# Patient Record
Sex: Female | Born: 1995
Health system: Southern US, Community
[De-identification: ages and names within clinical notes are randomized; demographics above are authoritative.]

## PROBLEM LIST (undated history)

## (undated) DIAGNOSIS — N6320 Unspecified lump in the left breast, unspecified quadrant: Secondary | ICD-10-CM

## (undated) DIAGNOSIS — F99 Mental disorder, not otherwise specified: Secondary | ICD-10-CM

## (undated) DIAGNOSIS — F419 Anxiety disorder, unspecified: Secondary | ICD-10-CM

## (undated) HISTORY — DX: Unspecified lump in the left breast, unspecified quadrant: N63.20

## (undated) HISTORY — DX: Mental disorder, not otherwise specified: F99

---
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
## 2017-01-13 DIAGNOSIS — Z86018 Personal history of other benign neoplasm: Secondary | ICD-10-CM | POA: Diagnosis not present

## 2017-01-13 DIAGNOSIS — B078 Other viral warts: Secondary | ICD-10-CM | POA: Diagnosis not present

## 2017-01-13 DIAGNOSIS — D224 Melanocytic nevi of scalp and neck: Secondary | ICD-10-CM | POA: Diagnosis not present

## 2017-01-13 DIAGNOSIS — L814 Other melanin hyperpigmentation: Secondary | ICD-10-CM | POA: Diagnosis not present

## 2017-03-03 DIAGNOSIS — R252 Cramp and spasm: Secondary | ICD-10-CM | POA: Diagnosis not present

## 2017-03-03 DIAGNOSIS — E7800 Pure hypercholesterolemia, unspecified: Secondary | ICD-10-CM | POA: Diagnosis not present

## 2017-03-03 DIAGNOSIS — R079 Chest pain, unspecified: Secondary | ICD-10-CM | POA: Diagnosis not present

## 2017-03-03 DIAGNOSIS — E162 Hypoglycemia, unspecified: Secondary | ICD-10-CM | POA: Diagnosis not present

## 2017-03-03 DIAGNOSIS — Z79899 Other long term (current) drug therapy: Secondary | ICD-10-CM | POA: Diagnosis not present

## 2017-03-04 DIAGNOSIS — E7800 Pure hypercholesterolemia, unspecified: Secondary | ICD-10-CM | POA: Diagnosis not present

## 2017-03-04 DIAGNOSIS — R079 Chest pain, unspecified: Secondary | ICD-10-CM | POA: Diagnosis not present

## 2017-03-20 DIAGNOSIS — Z23 Encounter for immunization: Secondary | ICD-10-CM | POA: Diagnosis not present

## 2017-03-22 ENCOUNTER — Encounter: Payer: Self-pay | Admitting: Obstetrics and Gynecology

## 2017-03-31 DIAGNOSIS — D485 Neoplasm of uncertain behavior of skin: Secondary | ICD-10-CM | POA: Diagnosis not present

## 2017-03-31 DIAGNOSIS — D225 Melanocytic nevi of trunk: Secondary | ICD-10-CM | POA: Diagnosis not present

## 2017-04-22 DIAGNOSIS — L700 Acne vulgaris: Secondary | ICD-10-CM | POA: Diagnosis not present

## 2017-04-22 DIAGNOSIS — D485 Neoplasm of uncertain behavior of skin: Secondary | ICD-10-CM | POA: Diagnosis not present

## 2017-05-06 DIAGNOSIS — L237 Allergic contact dermatitis due to plants, except food: Secondary | ICD-10-CM | POA: Diagnosis not present

## 2017-05-10 DIAGNOSIS — R5383 Other fatigue: Secondary | ICD-10-CM | POA: Diagnosis not present

## 2017-05-10 DIAGNOSIS — K921 Melena: Secondary | ICD-10-CM | POA: Diagnosis not present

## 2017-05-10 DIAGNOSIS — R21 Rash and other nonspecific skin eruption: Secondary | ICD-10-CM | POA: Diagnosis not present

## 2017-05-13 DIAGNOSIS — K921 Melena: Secondary | ICD-10-CM | POA: Diagnosis not present

## 2017-06-21 DIAGNOSIS — J0120 Acute ethmoidal sinusitis, unspecified: Secondary | ICD-10-CM | POA: Diagnosis not present

## 2017-07-12 DIAGNOSIS — N390 Urinary tract infection, site not specified: Secondary | ICD-10-CM | POA: Diagnosis not present

## 2017-07-20 DIAGNOSIS — K529 Noninfective gastroenteritis and colitis, unspecified: Secondary | ICD-10-CM | POA: Diagnosis not present

## 2017-07-20 DIAGNOSIS — R369 Urethral discharge, unspecified: Secondary | ICD-10-CM | POA: Diagnosis not present

## 2017-07-20 DIAGNOSIS — N760 Acute vaginitis: Secondary | ICD-10-CM | POA: Diagnosis not present

## 2017-07-20 DIAGNOSIS — R300 Dysuria: Secondary | ICD-10-CM | POA: Diagnosis not present

## 2017-08-12 ENCOUNTER — Telehealth: Payer: Self-pay | Admitting: Obstetrics and Gynecology

## 2017-08-12 NOTE — Telephone Encounter (Signed)
Patient called requesting to speak with the nurse about the possibility of getting labiaplasty.

## 2017-08-15 NOTE — Telephone Encounter (Signed)
Call returned to patient, no answer, unable to leave message, unable to leave message.

## 2017-08-15 NOTE — Telephone Encounter (Signed)
Please make an appointment with me to re-assess.

## 2017-08-15 NOTE — Telephone Encounter (Signed)
Patient returning call.

## 2017-08-15 NOTE — Telephone Encounter (Signed)
Call returned, no answer, mailbox full unable to leave message.

## 2017-08-15 NOTE — Telephone Encounter (Signed)
Spoke with patient. Patient inquiring about labiaplasty, states she discussed this with Dr. Quincy Simmonds at last AEX, but felt this was not an option at that time.   Patient is concerned about the appearance of her labia and would like to revisit discussing labiaplasty again if it will be considered by Dr. Quincy Simmonds or other providers in our office. Advised will review with Dr. Quincy Simmonds and return call with recommendations.   Dr. Quincy Simmonds - please review and advise?

## 2017-08-16 NOTE — Telephone Encounter (Signed)
Patient returned call to nurse Jill. °

## 2017-08-17 NOTE — Telephone Encounter (Signed)
Spoke with patient, OV scheduled for 09/05/17 at 3:30pm with Dr. Quincy Simmonds. Patient is agreeable to date and time.  Routing to provider for final review. Patient is agreeable to disposition. Will close encounter.

## 2017-09-02 ENCOUNTER — Telehealth: Payer: Self-pay | Admitting: Obstetrics and Gynecology

## 2017-09-02 NOTE — Telephone Encounter (Signed)
Patient cancelled appointment and is not ready to reschedule at this time.

## 2017-09-02 NOTE — Telephone Encounter (Signed)
Thank you for the update.  Encounter closed. 

## 2017-09-05 ENCOUNTER — Ambulatory Visit: Payer: Self-pay | Admitting: Obstetrics and Gynecology

## 2017-10-28 DIAGNOSIS — L03113 Cellulitis of right upper limb: Secondary | ICD-10-CM | POA: Diagnosis not present

## 2017-10-28 DIAGNOSIS — S40861A Insect bite (nonvenomous) of right upper arm, initial encounter: Secondary | ICD-10-CM | POA: Diagnosis not present

## 2017-11-06 DIAGNOSIS — L255 Unspecified contact dermatitis due to plants, except food: Secondary | ICD-10-CM | POA: Diagnosis not present

## 2017-11-08 DIAGNOSIS — D485 Neoplasm of uncertain behavior of skin: Secondary | ICD-10-CM | POA: Diagnosis not present

## 2017-11-08 DIAGNOSIS — D224 Melanocytic nevi of scalp and neck: Secondary | ICD-10-CM | POA: Diagnosis not present

## 2017-11-08 DIAGNOSIS — L237 Allergic contact dermatitis due to plants, except food: Secondary | ICD-10-CM | POA: Diagnosis not present

## 2017-11-29 DIAGNOSIS — Z0000 Encounter for general adult medical examination without abnormal findings: Secondary | ICD-10-CM | POA: Diagnosis not present

## 2017-12-21 DIAGNOSIS — E7800 Pure hypercholesterolemia, unspecified: Secondary | ICD-10-CM | POA: Diagnosis not present

## 2018-01-06 ENCOUNTER — Other Ambulatory Visit (HOSPITAL_COMMUNITY)
Admission: RE | Admit: 2018-01-06 | Discharge: 2018-01-06 | Disposition: A | Discharge status: Home / Self Care | Payer: 59 | Source: Ambulatory Visit | Attending: Obstetrics and Gynecology | Admitting: Obstetrics and Gynecology

## 2018-01-06 ENCOUNTER — Other Ambulatory Visit: Payer: Self-pay

## 2018-01-06 ENCOUNTER — Ambulatory Visit: Payer: 59 | Admitting: Obstetrics and Gynecology

## 2018-01-06 ENCOUNTER — Encounter: Payer: Self-pay | Admitting: Obstetrics and Gynecology

## 2018-01-06 VITALS — BP 108/60 | HR 76 | Resp 16 | Ht 62.0 in | Wt 113.0 lb

## 2018-01-06 DIAGNOSIS — Z01419 Encounter for gynecological examination (general) (routine) without abnormal findings: Secondary | ICD-10-CM

## 2018-01-06 DIAGNOSIS — Z113 Encounter for screening for infections with a predominantly sexual mode of transmission: Secondary | ICD-10-CM | POA: Insufficient documentation

## 2018-01-06 DIAGNOSIS — Z23 Encounter for immunization: Secondary | ICD-10-CM

## 2018-01-06 MED ORDER — NORGESTIM-ETH ESTRAD TRIPHASIC 0.18/0.215/0.25 MG-35 MCG PO TABS: 1.0000 | ORAL_TABLET | Freq: Every day | ORAL | 3 refills | Status: DC

## 2018-01-06 NOTE — Patient Instructions (Signed)

## 2018-01-06 NOTE — Progress Notes (Signed)
22 y.o. G66P0000 Single Caucasian female here for annual exam.    Asking about Gardasil vaccine.   Menses are light on her OCPs.  Cramping for the first two days.   Same partner for 6 years.  Living together in their own home.   Saw her PCP about her cholesterol and TG. She will have repeat labs in August.   Going to radiography school.  PCP: Crissie Sickles, PA-C    Patient's last menstrual period was 12/19/2017.           Sexually active: Yes.    The current method of family planning is Tri-Previfem.    Exercising: No.  The patient does not participate in regular exercise at present. Smoker:  no  Health Maintenance: Pap:  n/a History of abnormal Pap:  n/a TDaP:  UTD Gardasil:   no HIV and Hep C: 10/18/16 Negative Screening Labs:  Discuss today   reports that she has never smoked. She has never used smokeless tobacco. She reports that she does not drink alcohol or use drugs.  History reviewed. No pertinent past medical history.  Past Surgical History:  Procedure Laterality Date  . broken finger Right 2015   --ring finger    Current Outpatient Medications  Medication Sig Dispense Refill  . Norgestimate-Ethinyl Estradiol Triphasic (TRI-PREVIFEM) 0.18/0.215/0.25 MG-35 MCG tablet Take 1 tablet by mouth daily. 3 Package 3   No current facility-administered medications for this visit.     Family History  Problem Relation Age of Onset  . Diabetes Maternal Grandmother   . Hypertension Maternal Grandmother   . Hypertension Maternal Grandfather   . Osteoarthritis Maternal Grandfather     Review of Systems  All other systems reviewed and are negative.   Exam:   BP 108/60 (BP Location: Right Arm, Patient Position: Sitting, Cuff Size: Normal)   Pulse 76   Resp 16   Ht 5\' 2"  (1.575 m)   Wt 113 lb (51.3 kg)   LMP 12/19/2017   BMI 20.67 kg/m     General appearance: alert, cooperative and appears stated age Head: Normocephalic, without obvious abnormality,  atraumatic Neck: no adenopathy, supple, symmetrical, trachea midline and thyroid normal to inspection and palpation Lungs: clear to auscultation bilaterally Breasts: normal appearance, no masses or tenderness, No nipple retraction or dimpling, No nipple discharge or bleeding, No axillary or supraclavicular adenopathy Heart: regular rate and rhythm Abdomen: soft, non-tender; no masses, no organomegaly Extremities: extremities normal, atraumatic, no cyanosis or edema Skin: Skin color, texture, turgor normal. No rashes or lesions Lymph nodes: Cervical, supraclavicular, and axillary nodes normal. No abnormal inguinal nodes palpated Neurologic: Grossly normal  Pelvic: External genitalia:  no lesions              Urethra:  normal appearing urethra with no masses, tenderness or lesions              Bartholins and Skenes: normal                 Vagina: normal appearing vagina with normal color and discharge, no lesions              Cervix: no lesions              Pap taken: No. Bimanual Exam:  Uterus:  normal size, contour, position, consistency, mobility, non-tender              Adnexa: no mass, fullness, tenderness            Chaperone was  present for exam.  Assessment:   Well woman visit with normal exam. STD screening.   Plan: Mammogram screening age 37. Recommended self breast awareness. Pap and HR HPV age 3. Guidelines for Calcium, Vitamin D, regular exercise program including cardiovascular and weight bearing exercise. Refill of OCPs for one year.  Start Gardasil vaccine series. STD screening.  Follow up annually and prn.    After visit summary provided.

## 2018-01-07 LAB — HEP, RPR, HIV PANEL
HIV Screen 4th Generation wRfx: NONREACTIVE
Hepatitis B Surface Ag: NEGATIVE
RPR: NONREACTIVE

## 2018-01-07 LAB — HEPATITIS C ANTIBODY

## 2018-01-09 LAB — CERVICOVAGINAL ANCILLARY ONLY
CHLAMYDIA, DNA PROBE: NEGATIVE
NEISSERIA GONORRHEA: NEGATIVE
TRICH (WINDOWPATH): NEGATIVE

## 2018-01-10 ENCOUNTER — Telehealth: Payer: Self-pay

## 2018-01-10 NOTE — Telephone Encounter (Signed)
Spoke with patient. Results given. Patient verbalizes understanding. Encounter closed. 

## 2018-01-10 NOTE — Telephone Encounter (Signed)
-----   Message from Nunzio Cobbs, MD sent at 01/09/2018  5:25 PM EDT ----- Please report negative STD screening to patient.  This includes HIV, syphilis, hep B and C, gonorrhea, chlamydia, and trichomonas.  I am highlighting this so you know to contact the patient.

## 2018-02-14 DIAGNOSIS — L237 Allergic contact dermatitis due to plants, except food: Secondary | ICD-10-CM | POA: Diagnosis not present

## 2018-02-15 ENCOUNTER — Ambulatory Visit
Admission: RE | Admit: 2018-02-15 | Discharge: 2018-02-15 | Disposition: A | Discharge status: Home / Self Care | Payer: 59 | Source: Ambulatory Visit | Attending: *Deleted | Admitting: *Deleted

## 2018-02-15 ENCOUNTER — Telehealth: Payer: Self-pay | Admitting: Obstetrics and Gynecology

## 2018-02-15 ENCOUNTER — Other Ambulatory Visit: Payer: Self-pay | Admitting: *Deleted

## 2018-02-15 ENCOUNTER — Ambulatory Visit: Payer: Self-pay | Admitting: Obstetrics and Gynecology

## 2018-02-15 DIAGNOSIS — N63 Unspecified lump in unspecified breast: Secondary | ICD-10-CM

## 2018-02-15 DIAGNOSIS — N6320 Unspecified lump in the left breast, unspecified quadrant: Secondary | ICD-10-CM

## 2018-02-15 DIAGNOSIS — N6321 Unspecified lump in the left breast, upper outer quadrant: Secondary | ICD-10-CM | POA: Diagnosis not present

## 2018-02-15 DIAGNOSIS — N6323 Unspecified lump in the left breast, lower outer quadrant: Secondary | ICD-10-CM | POA: Diagnosis not present

## 2018-02-15 NOTE — Telephone Encounter (Signed)
Patient is asking to talk with [name] again.

## 2018-02-15 NOTE — Telephone Encounter (Signed)
Reviewed with Dr. Quincy Simmonds, call returned to patient. OV scheduled for today at 3:45pm.   Patient verbalizes understanding and is agreeable. Encounter closed.

## 2018-02-15 NOTE — Telephone Encounter (Signed)
Spoke with patient. Reports left breast lump under nipple, hard, pea size. Noticed last night. Denies nipple d/c, pain or skin changes. On menses now. Patient requesting order for breast US.   Recommended OV for further evaluation, patient declined OV offered for 9/5 or 9/6. Patient request first available provider. Advised I will review schedule with Dr. Quincy Simmonds and return call, patient agreeable.

## 2018-02-15 NOTE — Telephone Encounter (Signed)
Patient found a breast lump yesterday. Patient is asking for an order for a MMG.

## 2018-02-15 NOTE — Telephone Encounter (Signed)
Spoke with patient. Patient request to cancel OV scheduled for today for left breast lump. Patient states she "got an order for breast ultrasound". OV canceled for today with Dr. Quincy Simmonds.   Routing to provider for final review. Patient is agreeable to disposition. Will close encounter.

## 2018-02-21 DIAGNOSIS — Z79899 Other long term (current) drug therapy: Secondary | ICD-10-CM | POA: Diagnosis not present

## 2018-02-21 DIAGNOSIS — R12 Heartburn: Secondary | ICD-10-CM | POA: Diagnosis not present

## 2018-02-21 DIAGNOSIS — E7800 Pure hypercholesterolemia, unspecified: Secondary | ICD-10-CM | POA: Diagnosis not present

## 2018-03-02 ENCOUNTER — Telehealth: Payer: Self-pay | Admitting: Obstetrics and Gynecology

## 2018-03-02 NOTE — Telephone Encounter (Signed)
Called patient to reschedule her 2nd Gardasil to a different day than 03/09/18 but she declined to reschedule. She said she does not want to continue with getting these vaccines.

## 2018-03-03 NOTE — Telephone Encounter (Signed)
Thank you for the update.  Encounter closed. 

## 2018-03-09 ENCOUNTER — Ambulatory Visit: Payer: 59

## 2018-03-09 DIAGNOSIS — R7989 Other specified abnormal findings of blood chemistry: Secondary | ICD-10-CM | POA: Diagnosis not present

## 2018-03-13 DIAGNOSIS — J018 Other acute sinusitis: Secondary | ICD-10-CM | POA: Diagnosis not present

## 2018-04-14 DIAGNOSIS — L659 Nonscarring hair loss, unspecified: Secondary | ICD-10-CM | POA: Diagnosis not present

## 2018-04-14 DIAGNOSIS — R799 Abnormal finding of blood chemistry, unspecified: Secondary | ICD-10-CM | POA: Diagnosis not present

## 2018-05-03 DIAGNOSIS — J069 Acute upper respiratory infection, unspecified: Secondary | ICD-10-CM | POA: Diagnosis not present

## 2018-05-03 DIAGNOSIS — Z6822 Body mass index (BMI) 22.0-22.9, adult: Secondary | ICD-10-CM | POA: Diagnosis not present

## 2018-05-03 DIAGNOSIS — M791 Myalgia, unspecified site: Secondary | ICD-10-CM | POA: Diagnosis not present

## 2018-05-18 DIAGNOSIS — Z6823 Body mass index (BMI) 23.0-23.9, adult: Secondary | ICD-10-CM | POA: Diagnosis not present

## 2018-05-18 DIAGNOSIS — L290 Pruritus ani: Secondary | ICD-10-CM | POA: Diagnosis not present

## 2018-05-18 DIAGNOSIS — L237 Allergic contact dermatitis due to plants, except food: Secondary | ICD-10-CM | POA: Diagnosis not present

## 2018-07-31 DIAGNOSIS — Z6823 Body mass index (BMI) 23.0-23.9, adult: Secondary | ICD-10-CM | POA: Diagnosis not present

## 2018-07-31 DIAGNOSIS — R21 Rash and other nonspecific skin eruption: Secondary | ICD-10-CM | POA: Diagnosis not present

## 2018-08-09 ENCOUNTER — Other Ambulatory Visit: Payer: 59

## 2018-08-11 ENCOUNTER — Ambulatory Visit
Admission: RE | Admit: 2018-08-11 | Discharge: 2018-08-11 | Disposition: A | Discharge status: Home / Self Care | Payer: 59 | Source: Ambulatory Visit | Attending: *Deleted | Admitting: *Deleted

## 2018-08-11 ENCOUNTER — Other Ambulatory Visit: Payer: Self-pay | Admitting: *Deleted

## 2018-08-11 DIAGNOSIS — N6320 Unspecified lump in the left breast, unspecified quadrant: Secondary | ICD-10-CM

## 2018-08-11 DIAGNOSIS — N6002 Solitary cyst of left breast: Secondary | ICD-10-CM | POA: Diagnosis not present

## 2018-08-17 ENCOUNTER — Other Ambulatory Visit: Payer: 59

## 2018-10-12 ENCOUNTER — Ambulatory Visit: Payer: 59 | Admitting: Allergy and Immunology

## 2018-11-24 ENCOUNTER — Other Ambulatory Visit: Payer: Self-pay | Admitting: *Deleted

## 2018-11-24 NOTE — Telephone Encounter (Signed)
Medication refill request: Tri- previfem  Last AEX:  01-06-18 BS Next AEX: 01-19-2019  Last MMG (if hormonal medication request): 08-11-2018 BIRADS 3 probably benign, f/u 02/2019 Refill authorized: Today, please advise.   Medication pended for #3, 0RF as patient has aex on 01-19-2019. Please refill if appropriate.

## 2018-11-25 ENCOUNTER — Encounter: Payer: Self-pay | Admitting: Obstetrics and Gynecology

## 2018-11-25 MED ORDER — NORGESTIM-ETH ESTRAD TRIPHASIC 0.18/0.215/0.25 MG-35 MCG PO TABS: 1.0000 | ORAL_TABLET | Freq: Every day | ORAL | 0 refills | Status: DC

## 2019-01-18 ENCOUNTER — Other Ambulatory Visit: Payer: Self-pay

## 2019-01-19 ENCOUNTER — Ambulatory Visit: Payer: 59 | Admitting: Obstetrics and Gynecology

## 2019-01-19 NOTE — Progress Notes (Signed)
23 y.o. G0P0000 Single Caucasian female here for annual exam.   Patient lost her OCPs and hasn't been on them for 2 months. She want to continue on them.   Patient states has lump left breast [facility]  Her mother works there.  Her PCP ordered the testing.   Had ultrasound last year in September and has a probable cyst versus fibroadenoma. She had a follow up ultrasound in February and the cystic area is stable.  She is due again in September this year.   Studying x-ray technology. Engaged. They live together.   PCP:  Ernestene Kiel, MD   Patient's last menstrual period was 01/07/2019 (approximate).           Sexually active: Yes.    The current method of family planning is none.    Exercising: No.  The patient does not participate in regular exercise at present. Smoker:  no  Health Maintenance: Pap:  never History of abnormal Pap:  n/a MMG:  n/a Colonoscopy:  n/a BMD:   n/a  Result  n/a TDaP: ??due Gardasil:  Did 1st injection only;01-06-18 HIV:10-18-16 NR Hep C: 10-18-16 Neg Screening Labs:  ---   reports that she has never smoked. She has never used smokeless tobacco. She reports that she does not drink alcohol or use drugs.  Past Medical History:  Diagnosis Date  . Left breast mass    complicated cyst versus fibroadenoma - followed by ultrasound    Past Surgical History:  Procedure Laterality Date  . broken finger Right 2015   --ring finger    Current Outpatient Medications  Medication Sig Dispense Refill  . escitalopram (LEXAPRO) 5 MG tablet Take 1 tablet by mouth daily.    . famotidine (PEPCID) 20 MG tablet Take 1 tablet by mouth as needed.    . Norgestimate-Ethinyl Estradiol Triphasic (TRI-PREVIFEM) 0.18/0.215/0.25 MG-35 MCG tablet Take 1 tablet by mouth daily. (Patient not taking: Reported on 01/23/2019) 3 Package 0   No current facility-administered medications for this visit.     Family History  Problem Relation Age of  Onset  . Diabetes Maternal Grandmother   . Hypertension Maternal Grandmother   . Hypertension Maternal Grandfather   . Osteoarthritis Maternal Grandfather   . Breast cancer Paternal Aunt        early 64's    Review of Systems  All other systems reviewed and are negative.   Exam:   BP 100/60   Pulse 76   Temp (!) 97.3 F (36.3 C) (Temporal)   Resp 16   Ht 5\' 2"  (1.575 m)   Wt 127 lb (57.6 kg)   LMP 01/07/2019 (Approximate)   BMI 23.23 kg/m     General appearance: alert, cooperative and appears stated age Head: normocephalic, without obvious abnormality, atraumatic Neck: no adenopathy, supple, symmetrical, trachea midline and thyroid normal to inspection and palpation Lungs: clear to auscultation bilaterally Breasts: right - normal appearance, no masses or tenderness, No nipple retraction or dimpling, No nipple discharge or bleeding, No axillary adenopathy Left - 4 mm firm mass at edge of the areola at 3 - 4:00. No  tenderness, No nipple retraction or dimpling, No nipple discharge or bleeding, No axillary adenopathy Heart: regular rate and rhythm Abdomen: soft, non-tender; no masses, no organomegaly Extremities: extremities normal, atraumatic, no cyanosis or edema Skin: skin color, texture, turgor normal. No rashes or lesions Lymph nodes: cervical, supraclavicular, and axillary nodes normal. Neurologic: grossly normal  Pelvic: External genitalia:  no lesions              No abnormal inguinal nodes palpated.              Urethra:  normal appearing urethra with no masses, tenderness or lesions              Bartholins and Skenes: normal                 Vagina: normal appearing vagina with normal color and discharge, no lesions              Cervix: no lesions              Pap taken: Yes.   Bimanual Exam:  Uterus:  normal size, contour, position, consistency, mobility, non-tender              Adnexa: no mass, fullness, tenderness            Chaperone was present for  exam.  Assessment:   Well woman visit with normal exam. Left breast cyst versus fibroadenoma.   Plan: Mammogram screening discussed. Self breast awareness reviewed. Pap and HR HPV as above. Guidelines for Calcium, Vitamin D, regular exercise program including cardiovascular and weight bearing exercise. Celina for refill of OCPs for one year.  STD screening.  TDap. She will return for completion of the Gardasil at her convenience.  Follow up annually and prn.   After visit summary provided.

## 2019-01-23 ENCOUNTER — Encounter: Payer: Self-pay | Admitting: Obstetrics and Gynecology

## 2019-01-23 ENCOUNTER — Other Ambulatory Visit (HOSPITAL_COMMUNITY)
Admission: RE | Admit: 2019-01-23 | Discharge: 2019-01-23 | Disposition: A | Discharge status: Home / Self Care | Payer: 59 | Source: Ambulatory Visit | Attending: Obstetrics and Gynecology | Admitting: Obstetrics and Gynecology

## 2019-01-23 ENCOUNTER — Ambulatory Visit: Payer: 59 | Admitting: Obstetrics and Gynecology

## 2019-01-23 ENCOUNTER — Other Ambulatory Visit: Payer: Self-pay

## 2019-01-23 VITALS — BP 100/60 | HR 76 | Temp 97.3°F | Resp 16 | Ht 62.0 in | Wt 127.0 lb

## 2019-01-23 DIAGNOSIS — Z01419 Encounter for gynecological examination (general) (routine) without abnormal findings: Secondary | ICD-10-CM | POA: Diagnosis not present

## 2019-01-23 DIAGNOSIS — Z113 Encounter for screening for infections with a predominantly sexual mode of transmission: Secondary | ICD-10-CM | POA: Insufficient documentation

## 2019-01-23 DIAGNOSIS — Z23 Encounter for immunization: Secondary | ICD-10-CM | POA: Diagnosis not present

## 2019-01-23 MED ORDER — NORGESTIM-ETH ESTRAD TRIPHASIC 0.18/0.215/0.25 MG-35 MCG PO TABS: 1.0000 | ORAL_TABLET | Freq: Every day | ORAL | 3 refills | Status: DC

## 2019-01-23 NOTE — Patient Instructions (Signed)

## 2019-01-24 LAB — HEPATITIS C ANTIBODY: Hep C Virus Ab: <0.1 s/co ratio (ref 0.0–0.9)

## 2019-01-24 LAB — HEP, RPR, HIV PANEL
HIV Screen 4th Generation wRfx: NONREACTIVE
Hepatitis B Surface Ag: NEGATIVE
RPR Ser Ql: NONREACTIVE

## 2019-01-25 LAB — CYTOLOGY - PAP
Chlamydia: NEGATIVE
Diagnosis: NEGATIVE
Neisseria Gonorrhea: NEGATIVE
Trichomonas: NEGATIVE

## 2019-02-13 ENCOUNTER — Inpatient Hospital Stay: Admission: RE | Admit: 2019-02-13 | Payer: 59 | Source: Ambulatory Visit

## 2019-02-20 ENCOUNTER — Other Ambulatory Visit: Payer: Self-pay

## 2019-02-20 ENCOUNTER — Ambulatory Visit
Admission: RE | Admit: 2019-02-20 | Discharge: 2019-02-20 | Disposition: A | Discharge status: Home / Self Care | Payer: 59 | Source: Ambulatory Visit | Attending: *Deleted | Admitting: *Deleted

## 2019-02-20 DIAGNOSIS — N6320 Unspecified lump in the left breast, unspecified quadrant: Secondary | ICD-10-CM

## 2019-11-14 IMAGING — US US BREAST*L* LIMITED INC AXILLA
1 series · 5 of 5 positions shown · non-contrast
Comparison: None.

CLINICAL DATA: 22-year-old female. Palpable lump in the LEFT breast
for 1 day.

EXAM:
ULTRASOUND OF THE LEFT BREAST

[Series 1: us breast*left* limited inc axilla · 0.05mm/px · 5 of 5 slices shown]
[im 1/5]
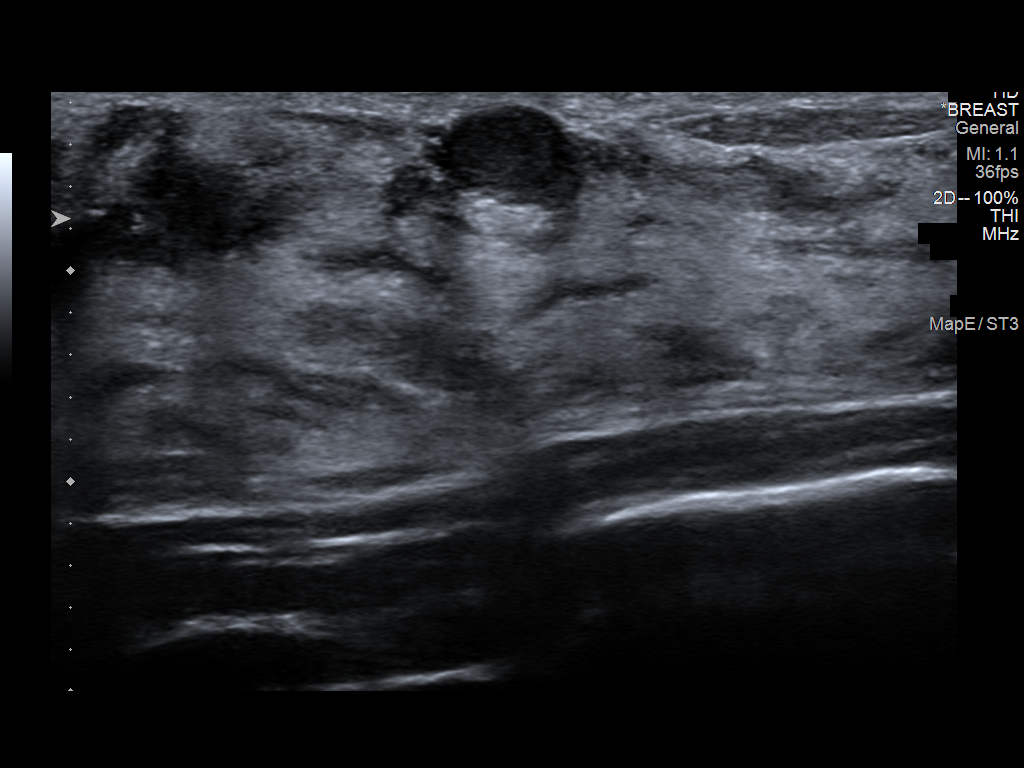
[im 2/5]
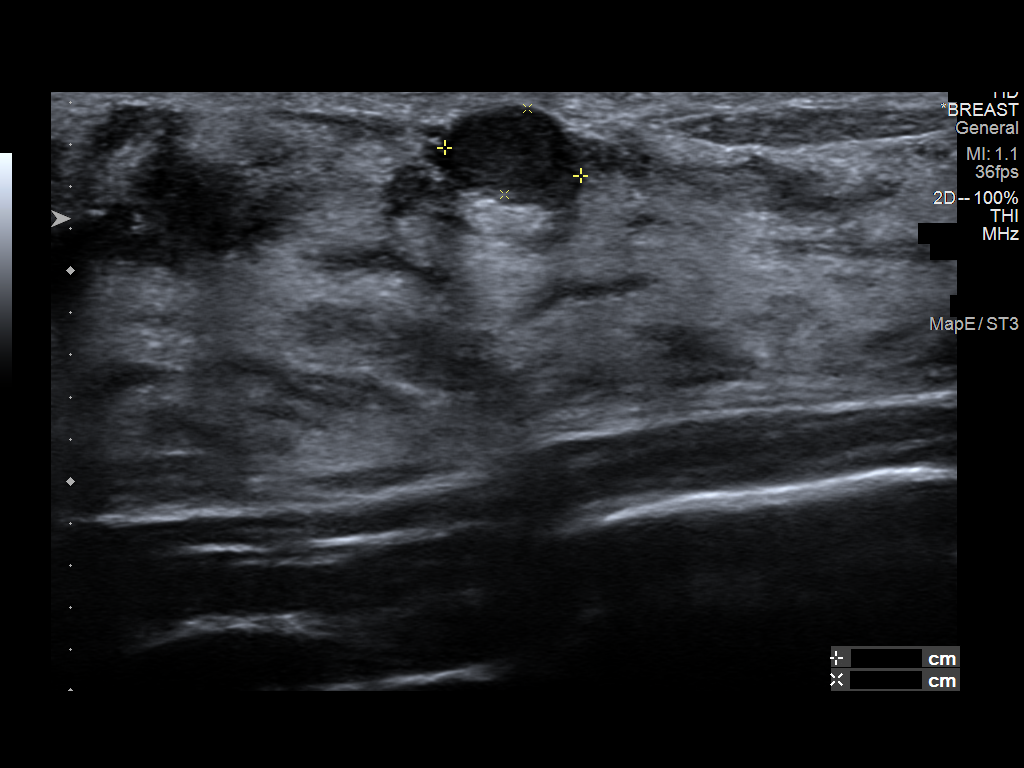
[im 3/5]
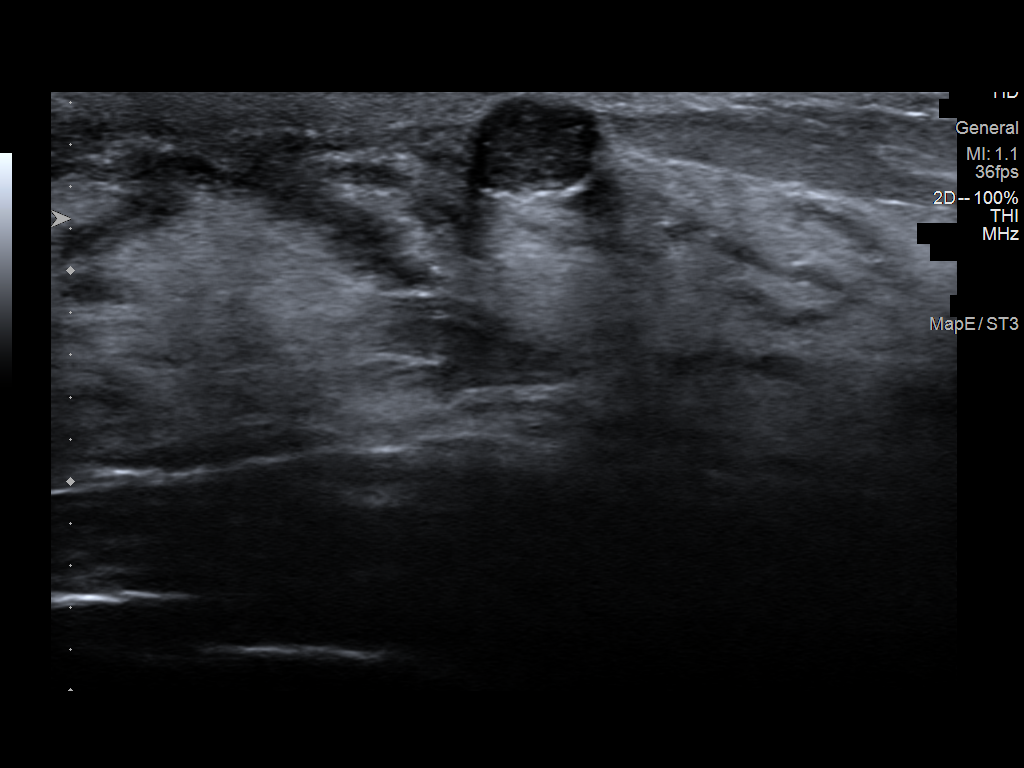
[im 4/5]
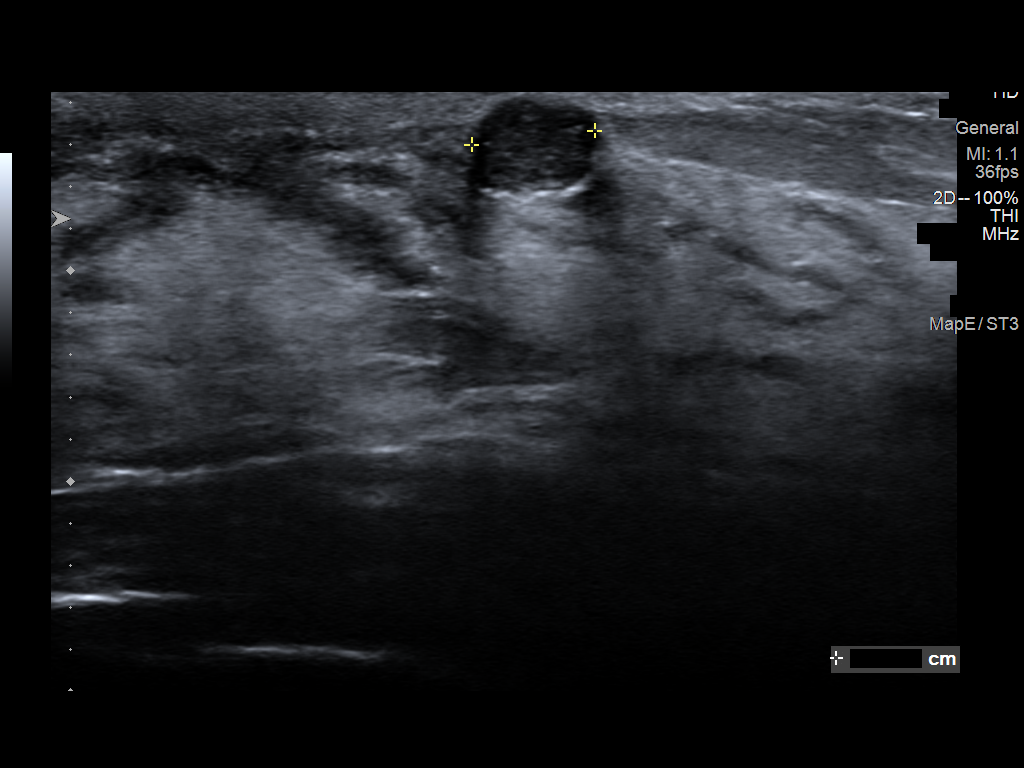
[im 5/5]
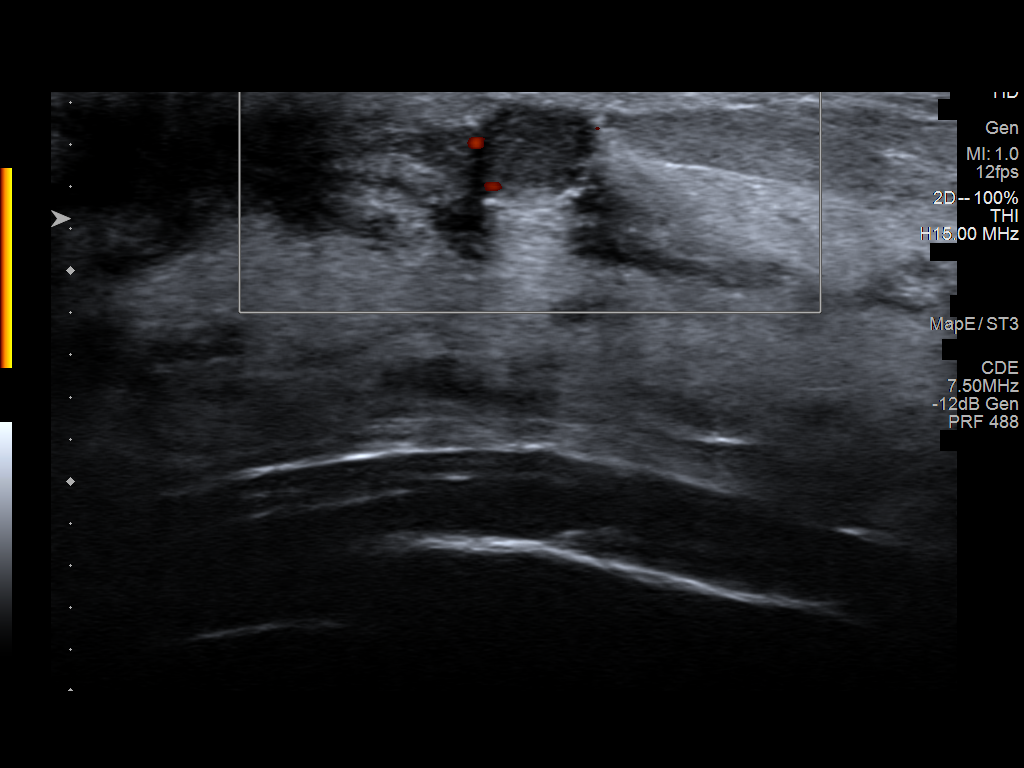

[5 of 5 positions shown; findings below may reference images not displayed]

FINDINGS: Targeted ultrasound is performed, evaluating the periareolar LEFT
breast with particular attention to the 3 o'clock axis as directed
by the patient, showing an oval circumscribed hypoechoic mass at the
3 o'clock axis, 1 cm from the nipple, just below the skin, measuring
7 x 4 x 6 mm, without convincing internal vascularity, corresponding
to the palpable lump.
IMPRESSION: Probably benign complicated cyst with internal debris versus
fibroadenoma in the LEFT breast at the 3 o'clock axis, 1 cm from the
nipple, measuring 7 x 4 x 6 mm, corresponding to the area of
clinical concern. Recommend follow-up LEFT breast ultrasound in 6
months to ensure stability.

RECOMMENDATION:
LEFT breast ultrasound in 6 months.

The patient was instructed to return sooner if the area that she
feels becomes larger and/or firmer to palpation, or if a new
palpable abnormality is identified in either breast.

I have discussed the findings and recommendations with the patient.
Results were also provided in writing at the conclusion of the
visit. If applicable, a reminder letter will be sent to the patient
regarding the next appointment.

BI-RADS CATEGORY  3: Probably benign.

## 2020-01-24 ENCOUNTER — Ambulatory Visit: Payer: 59 | Admitting: Obstetrics and Gynecology

## 2020-05-14 DIAGNOSIS — A749 Chlamydial infection, unspecified: Secondary | ICD-10-CM

## 2020-05-14 HISTORY — DX: Chlamydial infection, unspecified: A74.9

## 2020-06-02 ENCOUNTER — Ambulatory Visit: Payer: 59 | Admitting: Obstetrics and Gynecology

## 2020-06-02 NOTE — Progress Notes (Deleted)
24 y.o. G0P0000 Single Caucasian female here for annual exam.    PCP:     No LMP recorded.           Sexually active: {yes no:314532}   The current method of family planning is {contraception:315051}.    Exercising: {yes no:314532}  {types:19826} Smoker:  no  Health Maintenance: Pap: 01-23-19 Neg History of abnormal Pap:  no MMG:  02-20-19 follow up Lt.Br.US/Prob.benign fibroadenoma/Rec.Lt.Br.US 12 months/biRads3 Colonoscopy:  n/a BMD:   n/a  Result  n/a TDaP: 01-23-19 Gardasil:   Only did 1st injection HIV: 10-18-16 NR Hep C: 10-18-16 Neg Screening Labs:  Hb today: ***, Urine today: ***   reports that she has never smoked. She has never used smokeless tobacco. She reports that she does not drink alcohol and does not use drugs.  Past Medical History:  Diagnosis Date  . Left breast mass    complicated cyst versus fibroadenoma - followed by ultrasound    Past Surgical History:  Procedure Laterality Date  . broken finger Right 2015   --ring finger    Current Outpatient Medications  Medication Sig Dispense Refill  . escitalopram (LEXAPRO) 5 MG tablet Take 1 tablet by mouth daily.    . famotidine (PEPCID) 20 MG tablet Take 1 tablet by mouth as needed.    . Norgestimate-Ethinyl Estradiol Triphasic (TRI-PREVIFEM) 0.18/0.215/0.25 MG-35 MCG tablet Take 1 tablet by mouth daily. 3 Package 3   No current facility-administered medications for this visit.    Family History  Problem Relation Age of Onset  . Diabetes Maternal Grandmother   . Hypertension Maternal Grandmother   . Hypertension Maternal Grandfather   . Osteoarthritis Maternal Grandfather   . Breast cancer Paternal Aunt        early 34's    Review of Systems  Exam:   There were no vitals taken for this visit.    General appearance: alert, cooperative and appears stated age Head: normocephalic, without obvious abnormality, atraumatic Neck: no adenopathy, supple, symmetrical, trachea midline and thyroid normal to  inspection and palpation Lungs: clear to auscultation bilaterally Breasts: normal appearance, no masses or tenderness, No nipple retraction or dimpling, No nipple discharge or bleeding, No axillary adenopathy Heart: regular rate and rhythm Abdomen: soft, non-tender; no masses, no organomegaly Extremities: extremities normal, atraumatic, no cyanosis or edema Skin: skin color, texture, turgor normal. No rashes or lesions Lymph nodes: cervical, supraclavicular, and axillary nodes normal. Neurologic: grossly normal  Pelvic: External genitalia:  no lesions              No abnormal inguinal nodes palpated.              Urethra:  normal appearing urethra with no masses, tenderness or lesions              Bartholins and Skenes: normal                 Vagina: normal appearing vagina with normal color and discharge, no lesions              Cervix: no lesions              Pap taken: {yes no:314532} Bimanual Exam:  Uterus:  normal size, contour, position, consistency, mobility, non-tender              Adnexa: no mass, fullness, tenderness              Rectal exam: {yes no:314532}.  Confirms.  Anus:  normal sphincter tone, no lesions  Chaperone was present for exam.  Assessment:   Well woman visit with normal exam.   Plan: Mammogram screening discussed. Self breast awareness reviewed. Pap and HR HPV as above. Guidelines for Calcium, Vitamin D, regular exercise program including cardiovascular and weight bearing exercise.   Follow up annually and prn.   Additional counseling given.  {yes [id number]. _______ minutes face to face time of which over 50% was spent in counseling.    After visit summary provided.

## 2020-06-10 ENCOUNTER — Encounter: Payer: Self-pay | Admitting: Obstetrics and Gynecology

## 2020-06-10 ENCOUNTER — Ambulatory Visit: Payer: 59 | Admitting: Obstetrics and Gynecology

## 2020-06-10 ENCOUNTER — Other Ambulatory Visit: Payer: Self-pay

## 2020-06-10 ENCOUNTER — Telehealth: Payer: Self-pay | Admitting: Obstetrics and Gynecology

## 2020-06-10 ENCOUNTER — Other Ambulatory Visit (HOSPITAL_COMMUNITY)
Admission: RE | Admit: 2020-06-10 | Discharge: 2020-06-10 | Disposition: A | Discharge status: Home / Self Care | Payer: 59 | Source: Ambulatory Visit | Attending: Obstetrics and Gynecology | Admitting: Obstetrics and Gynecology

## 2020-06-10 VITALS — BP 118/62 | HR 80 | Resp 18 | Ht 61.75 in | Wt 127.0 lb

## 2020-06-10 DIAGNOSIS — Z3009 Encounter for other general counseling and advice on contraception: Secondary | ICD-10-CM

## 2020-06-10 DIAGNOSIS — Z23 Encounter for immunization: Secondary | ICD-10-CM | POA: Diagnosis not present

## 2020-06-10 DIAGNOSIS — Z01419 Encounter for gynecological examination (general) (routine) without abnormal findings: Secondary | ICD-10-CM

## 2020-06-10 DIAGNOSIS — Z113 Encounter for screening for infections with a predominantly sexual mode of transmission: Secondary | ICD-10-CM

## 2020-06-10 NOTE — Telephone Encounter (Signed)
Please schedule left breast US at [facility].   She has a fibroadenoma which [facility].  She wishes to change locations.

## 2020-06-10 NOTE — Progress Notes (Signed)
24 y.o. G0P0000 Single Caucasian female here for annual exam.    Menses are short and regular.  Wants to be on birth control but not a low level of hormone.  No significant cramping with her cycles. Interested in an IUD.   Is now not engaged.   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  Not vaccinated against Covid.  PCP: Ernestene Kiel, MD     Patient's last menstrual period was 06/01/2020.           Sexually active: Yes.    The current method of family planning is none.    Exercising: No.  The patient does not participate in regular exercise at present. Smoker:  no  Health Maintenance: Pap:  01-23-19 Neg History of abnormal Pap:  no MMG:  n/a Colonoscopy:  n/a BMD:   n/a  Result  n/a TDaP: 01-23-19 Gardasil:  Did 1st injection only;01-06-18. HIV: 10-18-16 NR Hep C: 10-18-16 Neg Screening Labs:  Discuss STD testing due to change in partner   reports that she has never smoked. She has never used smokeless tobacco. She reports that she does not drink alcohol and does not use drugs.  Past Medical History:  Diagnosis Date  . Left breast mass    complicated cyst versus fibroadenoma - followed by ultrasound    Past Surgical History:  Procedure Laterality Date  . broken finger Right 2015   --ring finger    Current Outpatient Medications  Medication Sig Dispense Refill  . escitalopram (LEXAPRO) 5 MG tablet Take 1 tablet by mouth daily.    . famotidine (PEPCID) 20 MG tablet Take 1 tablet by mouth as needed.    Marland Kitchen omeprazole (PRILOSEC) 20 MG capsule Take 20 mg by mouth daily.     No current facility-administered medications for this visit.    Family History  Problem Relation Age of Onset  . Diabetes Maternal Grandmother   . Hypertension Maternal Grandmother   . Hypertension Maternal Grandfather   . Osteoarthritis Maternal Grandfather   . Breast cancer Paternal Aunt        early 79's    Review of Systems  Constitutional: Negative.   HENT: Negative.   Eyes: Negative.    Respiratory: Negative.   Cardiovascular: Negative.   Gastrointestinal: Negative.   Endocrine: Negative.   Genitourinary: Negative.   Musculoskeletal: Negative.   Skin: Negative.   Allergic/Immunologic: Negative.   Neurological: Negative.   Hematological: Negative.   Psychiatric/Behavioral: Negative.     Exam:   BP 118/62 (BP Location: Right Arm, Patient Position: Sitting, Cuff Size: Normal)   Pulse 80   Resp 18   Ht 5' 1.75" (1.568 m)   Wt 127 lb (57.6 kg)   LMP 06/01/2020   BMI 23.42 kg/m     General appearance: alert, cooperative and appears stated age Head: normocephalic, without obvious abnormality, atraumatic Neck: no adenopathy, supple, symmetrical, trachea midline and thyroid normal to inspection and palpation Lungs: clear to auscultation bilaterally Breasts: normal appearance, no masses or tenderness on right and 4 mm firm area at 3:00 - 4:00 of left breast, No nipple retraction or dimpling, No nipple discharge or bleeding, No axillary adenopathy Heart: regular rate and rhythm Abdomen: soft, non-tender; no masses, no organomegaly Extremities: extremities normal, atraumatic, no cyanosis or edema Skin: skin color, texture, turgor normal. No rashes or lesions Lymph nodes: cervical, supraclavicular, and axillary nodes normal. Neurologic: grossly normal  Pelvic: External genitalia:  no lesions  No abnormal inguinal nodes palpated.              Urethra:  normal appearing urethra with no masses, tenderness or lesions              Bartholins and Skenes: normal                 Vagina: normal appearing vagina with normal color and discharge, no lesions              Cervix: no lesions              Pap taken: No. Bimanual Exam:  Uterus:  normal size, contour, position, consistency, mobility, non-tender              Adnexa: no mass, fullness, tenderness    Chaperone was present for exam.  Assessment:   Well woman visit with normal exam. Left breast cyst  versus fibroadenoma.  Plan: Will schedule breast US follow up at [facility] location, per patient request. Self breast awareness reviewed. Pap and HR HPV as above. Guidelines for Calcium, Vitamin D, regular exercise program including cardiovascular and weight bearing exercise. STD screening.  HPV vaccine.  We discussed IUDs in detail - progesterone and copper based.  Risks and benefits reviewed.   She wishes to proceed with a progesterone IUD.  Will precert. Follow up annually and prn.

## 2020-06-10 NOTE — Patient Instructions (Signed)

## 2020-06-11 LAB — HEP, RPR, HIV PANEL
HIV Screen 4th Generation wRfx: NONREACTIVE
Hepatitis B Surface Ag: NEGATIVE
RPR Ser Ql: NONREACTIVE

## 2020-06-11 LAB — CERVICOVAGINAL ANCILLARY ONLY
Chlamydia: POSITIVE — AB
Comment: NEGATIVE
Comment: NEGATIVE
Comment: NORMAL
Neisseria Gonorrhea: NEGATIVE
Trichomonas: NEGATIVE

## 2020-06-11 LAB — HEPATITIS C ANTIBODY: Hep C Virus Ab: <0.1 s/co ratio (ref 0.0–0.9)

## 2020-06-11 NOTE — Telephone Encounter (Signed)
Spoke with Aggie Cosier at [facility]. Patient scheduled for left breast US at [facility] [address] Rondall Allegra on 06/16/20 at 1pm. Confirmed order received.   Call placed to patient, advised as seen above. Patient states she is unsure if this date/time will work for her. Direct contact information provided to patient to r/s appt if needed. Patient is aware to contact the office if she has any additional questions.   Placed in MMG hold.   Routing to provider for final review. Patient is agreeable to disposition. Will close encounter.

## 2020-06-11 NOTE — Telephone Encounter (Signed)
Call placed to [facility], left message for Marcelino Duster, requested call back to schedule left breast US.   Order to Dr. Edward Jolly for signature.

## 2020-06-12 ENCOUNTER — Telehealth: Payer: Self-pay | Admitting: *Deleted

## 2020-06-12 ENCOUNTER — Encounter: Payer: Self-pay | Admitting: Obstetrics and Gynecology

## 2020-06-12 MED ORDER — DOXYCYCLINE HYCLATE 100 MG PO CAPS: 100.0000 mg | ORAL_CAPSULE | Freq: Two times a day (BID) | ORAL | 0 refills | Status: AC

## 2020-06-12 NOTE — Telephone Encounter (Signed)
Spoke with patient.  Advised of results as seen below per Dr. Edward Jolly.  Rx sent to verified pharmacy.  Expedited partner therapy reviewed. Partner information provided. Patient will pick up RX from front office thi9s afternoon.   66mo TOC OV scheduled for 09/10/19 at 4pm.   Patient verbalizes understanding and is agreeable.

## 2020-06-12 NOTE — Telephone Encounter (Signed)
-----   Message from Patton Salles, MD sent at 06/12/2020  6:03 AM EST ----- Please contact patient with results of her STD screening.  She tested positive for chlamydia.   All other testing is negative for HIV, syphilis, hepatitis B and C, gonorrhea, and trichomonas.  I am recommending doxycycline 100 mg po bid x 7 days.  Take with food.  Ok to offer expedited partner therapy with same doxycycline regimen.  I recommend abstaining from sex or one week after patient and partner complete treatment.  Condoms recommended.   Schedule a 3 month follow up with me.   Report to health department.

## 2020-06-17 NOTE — Telephone Encounter (Signed)
Leda Min, RN  06/17/2020 10:57 AM EST Back to Top     Confidential communicable Disease Report completed and faxed to [facility]   Encounter closed.

## 2020-07-10 ENCOUNTER — Telehealth: Payer: Self-pay

## 2020-07-10 NOTE — Telephone Encounter (Signed)
Dr.Silva please see the below. 

## 2020-07-10 NOTE — Telephone Encounter (Signed)
Spoke with patient regarding benefits for recommended [name] IUD Insertion. Patient acknowledges understanding of information presented. Patient is aware of cancellation policy. Patient scheduled for 07/14/2020.  Patient is asking about a prescription for "the medication that you insert prior to the procedure."  Routing to triage to assist patient with prescription.  Cc: Dr. Quincy Simmonds

## 2020-07-10 NOTE — Telephone Encounter (Signed)
Ok for Cytotec 200 mcg pv at hs the night before the IUD insertion and the am of the insertion.  Disp:  2  RF:  none.   I recommend over the counter Ibuprofen 800 mg po one hour prior to the IUD insertion.   I will need to do repeat chlamydia testing on the day of her IUD insertion as she was just diagnosed at the end of December.

## 2020-07-11 MED ORDER — MISOPROSTOL 200 MCG PO TABS: ORAL_TABLET | ORAL | 0 refills | Status: DC

## 2020-07-11 NOTE — Telephone Encounter (Signed)
Left message for patient to call.

## 2020-07-11 NOTE — Telephone Encounter (Signed)
Patient informed with all the below note.

## 2020-07-14 ENCOUNTER — Ambulatory Visit (INDEPENDENT_AMBULATORY_CARE_PROVIDER_SITE_OTHER): Payer: 59 | Admitting: Obstetrics and Gynecology

## 2020-07-14 ENCOUNTER — Other Ambulatory Visit (HOSPITAL_COMMUNITY)
Admission: RE | Admit: 2020-07-14 | Discharge: 2020-07-14 | Disposition: A | Discharge status: Home / Self Care | Payer: 59 | Source: Ambulatory Visit | Attending: Obstetrics and Gynecology | Admitting: Obstetrics and Gynecology

## 2020-07-14 ENCOUNTER — Other Ambulatory Visit: Payer: Self-pay

## 2020-07-14 ENCOUNTER — Encounter: Payer: Self-pay | Admitting: Obstetrics and Gynecology

## 2020-07-14 VITALS — BP 100/68 | HR 91 | Ht 62.0 in

## 2020-07-14 DIAGNOSIS — A749 Chlamydial infection, unspecified: Secondary | ICD-10-CM | POA: Diagnosis not present

## 2020-07-14 DIAGNOSIS — Z01812 Encounter for preprocedural laboratory examination: Secondary | ICD-10-CM

## 2020-07-14 DIAGNOSIS — Z3009 Encounter for other general counseling and advice on contraception: Secondary | ICD-10-CM | POA: Diagnosis not present

## 2020-07-14 DIAGNOSIS — Z30430 Encounter for insertion of intrauterine contraceptive device: Secondary | ICD-10-CM

## 2020-07-14 LAB — PREGNANCY, URINE: Preg Test, Ur: NEGATIVE

## 2020-07-14 NOTE — Progress Notes (Signed)
GYNECOLOGY  VISIT   HPI: 25 y.o.   Single  Caucasian  female   G0P0000 with Patient's last menstrual period was 07/09/2020 (exact date).   here for [facility] IUD insertion.   She used Cytotec for cervical prep of IUD insertion.  It made her cramp.   Took Ibuprofen 800 mg.   Dx with chlamydia 06/10/20. Took doxycycline for one week and tolerated it well.   UPT negative.  GYNECOLOGIC HISTORY: Patient's last menstrual period was 07/09/2020 (exact date). Contraception: condoms Menopausal hormone therapy:  n/a Last mammogram:  n/a Last pap smear:  01-23-19 Neg        OB History    Gravida  0   Para  0   Term  0   Preterm  0   AB  0   Living  0     SAB  0   IAB  0   Ectopic  0   Multiple  0   Live Births  0              There are no problems to display for this patient.   Past Medical History:  Diagnosis Date  . Chlamydia 05/2020  . Left breast mass    complicated cyst versus fibroadenoma - followed by ultrasound    Past Surgical History:  Procedure Laterality Date  . broken finger Right 2015   --ring finger    Current Outpatient Medications  Medication Sig Dispense Refill  . escitalopram (LEXAPRO) 5 MG tablet Take 1 tablet by mouth daily.    . famotidine (PEPCID) 20 MG tablet Take 1 tablet by mouth as needed.    . misoprostol (CYTOTEC) 200 MCG tablet Insert 1 tablet vaginally at bedtime the night before IUD insertion then 1 tablet in am of insertion. 2 tablet 0  . omeprazole (PRILOSEC) 20 MG capsule Take 20 mg by mouth daily.     No current facility-administered medications for this visit.     ALLERGIES: Guaifenesin  Family History  Problem Relation Age of Onset  . Diabetes Maternal Grandmother   . Hypertension Maternal Grandmother   . Hypertension Maternal Grandfather   . Osteoarthritis Maternal Grandfather   . Breast cancer Paternal Aunt        early 37's    Social History   Socioeconomic History  . Marital status: Single     Spouse name: Not on file  . Number of children: Not on file  . Years of education: Not on file  . Highest education level: Not on file  Occupational History  . Not on file  Tobacco Use  . Smoking status: Never Smoker  . Smokeless tobacco: Never Used  Vaping Use  . Vaping Use: Never used  Substance and Sexual Activity  . Alcohol use: No  . Drug use: No  . Sexual activity: Yes    Partners: Male    Birth control/protection: None  Other Topics Concern  . Not on file  Social History Narrative  . Not on file   Social Determinants of Health   Financial Resource Strain: Not on file  Food Insecurity: Not on file  Transportation Needs: Not on file  Physical Activity: Not on file  Stress: Not on file  Social Connections: Not on file  Intimate Partner Violence: Not on file    Review of Systems  All other systems reviewed and are negative.   PHYSICAL EXAMINATION:    BP 100/68   Pulse 91   Ht 5\' 2"  (1.575  m)   LMP 07/09/2020 (Exact Date)   SpO2 99%   BMI 23.23 kg/m     General appearance: alert, cooperative and appears stated age   Pelvic: External genitalia:  no lesions              Urethra:  normal appearing urethra with no masses, tenderness or lesions              Bartholins and Skenes: normal                 Vagina: normal appearing vagina with normal color and discharge, no lesions              Cervix: no lesions                Bimanual Exam:  Uterus:  normal size, contour, position, consistency, mobility, non-tender              Adnexa: no mass, fullness, tenderness              IUD insertion - [name], lot number [id number], exp Feb 2022.  Consent for procedure.  Sterile prep with Hibiclens.  Paracervical block with 10 cc 1% lidocaine, lot [id number], exp 12/2023. Uterus sounded to 6.5 cm.  [name] IUD placed without difficulty.  Strings trimmed and shown to patient.  Repeat BM exam, no change.  Minima EBL.  No complications.   Chaperone was present for  exam.  ASSESSMENT  [name] IUD placement.  Chlamydia infection.   PLAN  GC/CT testing done.  Post IUD instructions given.  Abstain for one week.  IUD card to patient.  FU in 1 month.

## 2020-07-14 NOTE — Patient Instructions (Signed)
Intrauterine Device Insertion, Care After This sheet gives you information about how to care for yourself after your procedure. Your health care provider may also give you more specific instructions. If you have problems or questions, contact your health care provider. What can I expect after the procedure? After the procedure, it is common to have:  Cramps and pain in the abdomen.  Bleeding. It may be light or heavy. This may last for a few days.  Lower back pain.  Dizziness.  Headaches.  Nausea. Follow these instructions at home:  Before resuming sexual activity, check to make sure that you can feel the IUD string or strings. You should be able to feel the end of the string below the opening of your cervix. If your IUD string is in place, you may resume sexual activity. ? If you had a hormonal IUD inserted more than 7 days after your most recent period started, you will need to use a backup method of birth control for 7 days after IUD insertion. Ask your health care provider whether this applies to you.  Continue to check that the IUD is still in place by feeling for the strings after every menstrual period, or once a month.  An IUD will not protect you from sexually transmitted infections (STIs). Use methods to prevent the exchange of body fluids between partners (barrier protection) every time you have sex. Barrier protection can be used during oral, vaginal, or anal sex. Commonly used barrier methods include: ? Female condom. ? Female condom. ? Dental dam.  Take over-the-counter and prescription medicines only as told by your health care provider.  Keep all follow-up visits as told by your health care provider. This is important.   Contact a health care provider if:  You feel light-headed or weak.  You have any of the following problems with your IUD string or strings: ? The string bothers or hurts you or your sexual partner. ? You cannot feel the string. ? The string has  gotten longer.  You can feel the IUD in your vagina.  You think you may be pregnant, or you miss your menstrual period.  You think you may have a sexually transmitted infection (STI). Get help right away if:  You have flu-like symptoms, such as tiredness (fatigue) and muscle aches.  You have a fever and chills.  You have bleeding that is heavier or lasts longer than a normal menstrual cycle.  You have abnormal or bad-smelling discharge from your vagina.  You develop abdominal pain that is new, is getting worse, or is not in the same area of earlier cramping and pain.  You have pain during sexual activity. Summary  After the procedure, it is common to have cramps and pain in the abdomen. It is also common to have light bleeding or heavier bleeding that is like your menstrual period.  Continue to check that the IUD is still in place by feeling for the strings after every menstrual period, or once a month.  Keep all follow-up visits as told by your health care provider. This is important.  Contact your health care provider if you have problems with your IUD strings, such as the string getting longer or bothering you or your sexual partner. This information is not intended to replace advice given to you by your health care provider. Make sure you discuss any questions you have with your health care provider. Document Revised: 05/22/2019 Document Reviewed: 05/22/2019 Elsevier Patient Education  [place].

## 2020-07-15 LAB — CERVICOVAGINAL ANCILLARY ONLY
Chlamydia: NEGATIVE
Comment: NEGATIVE
Comment: NORMAL
Neisseria Gonorrhea: NEGATIVE

## 2020-08-15 ENCOUNTER — Ambulatory Visit: Payer: 59 | Admitting: Obstetrics and Gynecology

## 2020-09-09 ENCOUNTER — Ambulatory Visit: Payer: Self-pay | Admitting: Obstetrics and Gynecology

## 2020-09-09 NOTE — Progress Notes (Deleted)
GYNECOLOGY  VISIT   HPI: 25 y.o.   Single  Caucasian  female   G0P0000 with No LMP recorded.   here for 3 month TOC.    GYNECOLOGIC HISTORY: No LMP recorded. Contraception: [name] IUD 07-14-20 Menopausal hormone therapy:  n/a Last mammogram:  n/a Last pap smear: 01-23-19 Neg        OB History    Gravida  0   Para  0   Term  0   Preterm  0   AB  0   Living  0     SAB  0   IAB  0   Ectopic  0   Multiple  0   Live Births  0              There are no problems to display for this patient.   Past Medical History:  Diagnosis Date  . Chlamydia 05/2020  . Left breast mass    complicated cyst versus fibroadenoma - followed by ultrasound    Past Surgical History:  Procedure Laterality Date  . broken finger Right 2015   --ring finger    Current Outpatient Medications  Medication Sig Dispense Refill  . escitalopram (LEXAPRO) 5 MG tablet Take 1 tablet by mouth daily.    . famotidine (PEPCID) 20 MG tablet Take 1 tablet by mouth as needed.    . misoprostol (CYTOTEC) 200 MCG tablet Insert 1 tablet vaginally at bedtime the night before IUD insertion then 1 tablet in am of insertion. 2 tablet 0  . omeprazole (PRILOSEC) 20 MG capsule Take 20 mg by mouth daily.     No current facility-administered medications for this visit.     ALLERGIES: Guaifenesin  Family History  Problem Relation Age of Onset  . Diabetes Maternal Grandmother   . Hypertension Maternal Grandmother   . Hypertension Maternal Grandfather   . Osteoarthritis Maternal Grandfather   . Breast cancer Paternal Aunt        early 41's    Social History   Socioeconomic History  . Marital status: Single    Spouse name: Not on file  . Number of children: Not on file  . Years of education: Not on file  . Highest education level: Not on file  Occupational History  . Not on file  Tobacco Use  . Smoking status: Never Smoker  . Smokeless tobacco: Never Used  Vaping Use  . Vaping Use: Never used   Substance and Sexual Activity  . Alcohol use: No  . Drug use: No  . Sexual activity: Yes    Partners: Male    Birth control/protection: None  Other Topics Concern  . Not on file  Social History Narrative  . Not on file   Social Determinants of Health   Financial Resource Strain: Not on file  Food Insecurity: Not on file  Transportation Needs: Not on file  Physical Activity: Not on file  Stress: Not on file  Social Connections: Not on file  Intimate Partner Violence: Not on file    Review of Systems  PHYSICAL EXAMINATION:    There were no vitals taken for this visit.    General appearance: alert, cooperative and appears stated age Head: Normocephalic, without obvious abnormality, atraumatic Neck: no adenopathy, supple, symmetrical, trachea midline and thyroid normal to inspection and palpation Lungs: clear to auscultation bilaterally Breasts: normal appearance, no masses or tenderness, No nipple retraction or dimpling, No nipple discharge or bleeding, No axillary or supraclavicular adenopathy Heart: regular rate  and rhythm Abdomen: soft, non-tender, no masses,  no organomegaly Extremities: extremities normal, atraumatic, no cyanosis or edema Skin: Skin color, texture, turgor normal. No rashes or lesions Lymph nodes: Cervical, supraclavicular, and axillary nodes normal. No abnormal inguinal nodes palpated Neurologic: Grossly normal  Pelvic: External genitalia:  no lesions              Urethra:  normal appearing urethra with no masses, tenderness or lesions              Bartholins and Skenes: normal                 Vagina: normal appearing vagina with normal color and discharge, no lesions              Cervix: no lesions                Bimanual Exam:  Uterus:  normal size, contour, position, consistency, mobility, non-tender              Adnexa: no mass, fullness, tenderness              Rectal exam: {yes no:314532}.  Confirms.              Anus:  normal sphincter  tone, no lesions  Chaperone was present for exam.  ASSESSMENT     PLAN     An After Visit Summary was printed and given to the patient.  ______ minutes face to face time of which over 50% was spent in counseling.

## 2020-10-14 ENCOUNTER — Ambulatory Visit: Payer: 59 | Admitting: Obstetrics and Gynecology

## 2020-10-16 ENCOUNTER — Other Ambulatory Visit: Payer: Self-pay

## 2020-10-16 ENCOUNTER — Encounter: Payer: Self-pay | Admitting: Obstetrics and Gynecology

## 2020-10-16 ENCOUNTER — Ambulatory Visit: Payer: 59 | Admitting: Obstetrics and Gynecology

## 2020-10-16 ENCOUNTER — Other Ambulatory Visit (HOSPITAL_COMMUNITY)
Admission: RE | Admit: 2020-10-16 | Discharge: 2020-10-16 | Disposition: A | Discharge status: Home / Self Care | Payer: 59 | Source: Ambulatory Visit | Attending: Obstetrics and Gynecology | Admitting: Obstetrics and Gynecology

## 2020-10-16 VITALS — BP 102/60 | HR 84 | Resp 16 | Ht 62.0 in | Wt 143.0 lb

## 2020-10-16 DIAGNOSIS — N898 Other specified noninflammatory disorders of vagina: Secondary | ICD-10-CM | POA: Insufficient documentation

## 2020-10-16 DIAGNOSIS — R300 Dysuria: Secondary | ICD-10-CM

## 2020-10-16 DIAGNOSIS — Z113 Encounter for screening for infections with a predominantly sexual mode of transmission: Secondary | ICD-10-CM

## 2020-10-16 DIAGNOSIS — N921 Excessive and frequent menstruation with irregular cycle: Secondary | ICD-10-CM | POA: Diagnosis not present

## 2020-10-16 DIAGNOSIS — Z30431 Encounter for routine checking of intrauterine contraceptive device: Secondary | ICD-10-CM

## 2020-10-16 LAB — PREGNANCY, URINE: Preg Test, Ur: NEGATIVE

## 2020-10-16 LAB — URINALYSIS, COMPLETE W/RFL CULTURE: Hyaline Cast: NONE SEEN /LPF

## 2020-10-16 LAB — CULTURE INDICATED

## 2020-10-16 MED ORDER — SULFAMETHOXAZOLE-TRIMETHOPRIM 800-160 MG PO TABS: 1.0000 | ORAL_TABLET | Freq: Two times a day (BID) | ORAL | 0 refills | Status: DC

## 2020-10-16 NOTE — Progress Notes (Signed)
GYNECOLOGY  VISIT   HPI: 25 y.o.   Single  Caucasian  female   G0P0000 with No LMP recorded.   here for vaginal burning. Irregular bleeding since IUD inserted 07/14/20.  Patient recently back with her ex-boyfriend for one night and concerned about Chlamydia. She had negative GC/CT testing on 07/14/20.   Burning with urination.  Not voiding often.  Pain in her bladder area.  No fever, shakes, chills or back pain.   No change in her discharge.   LMP was 1.5 weeks ago.   Happy with her IUD.   UPT:Neg  GYNECOLOGIC HISTORY: No LMP recorded. Contraception:  [name] IUD 07-14-20 Menopausal hormone therapy:  n/a Last mammogram:  n/a Last pap smear: 01-23-19 Neg        OB History    Gravida  0   Para  0   Term  0   Preterm  0   AB  0   Living  0     SAB  0   IAB  0   Ectopic  0   Multiple  0   Live Births  0              There are no problems to display for this patient.   Past Medical History:  Diagnosis Date  . Chlamydia 05/2020  . Left breast mass    complicated cyst versus fibroadenoma - followed by ultrasound    Past Surgical History:  Procedure Laterality Date  . broken finger Right 2015   --ring finger    Current Outpatient Medications  Medication Sig Dispense Refill  . escitalopram (LEXAPRO) 5 MG tablet Take 1 tablet by mouth daily.    Marland Kitchen omeprazole (PRILOSEC) 20 MG capsule Take 20 mg by mouth daily.    . phenazopyridine (PYRIDIUM) 95 MG tablet Take 95 mg by mouth 3 (three) times daily as needed for pain.    Marland Kitchen sulfamethoxazole-trimethoprim (BACTRIM DS) 800-160 MG tablet Take 1 tablet by mouth 2 (two) times daily. One PO BID x 3 days 6 tablet 0   No current facility-administered medications for this visit.     ALLERGIES: Guaifenesin  Family History  Problem Relation Age of Onset  . Diabetes Maternal Grandmother   . Hypertension Maternal Grandmother   . Hypertension Maternal Grandfather   . Osteoarthritis Maternal Grandfather   .  Breast cancer Paternal Aunt        early 70's    Social History   Socioeconomic History  . Marital status: Single    Spouse name: Not on file  . Number of children: Not on file  . Years of education: Not on file  . Highest education level: Not on file  Occupational History  . Not on file  Tobacco Use  . Smoking status: Never Smoker  . Smokeless tobacco: Never Used  Vaping Use  . Vaping Use: Never used  Substance and Sexual Activity  . Alcohol use: No  . Drug use: No  . Sexual activity: Yes    Partners: Male    Birth control/protection: None  Other Topics Concern  . Not on file  Social History Narrative  . Not on file   Social Determinants of Health   Financial Resource Strain: Not on file  Food Insecurity: Not on file  Transportation Needs: Not on file  Physical Activity: Not on file  Stress: Not on file  Social Connections: Not on file  Intimate Partner Violence: Not on file    Review of Systems  Genitourinary: Positive for dysuria.  All other systems reviewed and are negative.   PHYSICAL EXAMINATION:    BP 102/60   Pulse 84   Resp 16   Ht 5\' 2"  (1.575 m)   Wt 143 lb (64.9 kg)   BMI 26.16 kg/m     General appearance: alert, cooperative and appears stated age   Pelvic: External genitalia:  no lesions              Urethra:  normal appearing urethra with no masses, tenderness or lesions              Bartholins and Skenes: normal                 Vagina: normal appearing vagina with normal color and discharge, no lesion, green frothy discharge.  Tender anteriorly over bladder area.                Cervix: no lesions.  IUD strings noted.  No CMT.                  Bimanual Exam:  Uterus:  normal size, contour, position, consistency, mobility, slightly tender              Adnexa: no mass, fullness, tenderness           Chaperone was present for exam.  ASSESSMENT   IUD check up.  Irregular intermenstrual bleeding. Vaginal discharge.  STD screening.   Hx chlamydia.  Dysuria.   PLAN  Reassurance regarding IUD position.  Bleeding profiles with [name] discussed.  Urinalysis:  > 60 WBC, 0 - 2 RBC, 6 - 10 squams, many bacteria.   UC sent. Start Bactrim DS po bid x 3 days.  Ok to continue pyridium 200 mg po tid x 2 more days if needed.  Hydrate well.  Testing for GC/CT/trichomonas.  Fu prn.

## 2020-10-19 LAB — URINE CULTURE
MICRO NUMBER:: 11854570
SPECIMEN QUALITY:: ADEQUATE

## 2020-10-19 LAB — URINALYSIS, COMPLETE W/RFL CULTURE: WBC, UA: >=60 /HPF — AB (ref 0–5)

## 2020-10-20 ENCOUNTER — Other Ambulatory Visit: Payer: Self-pay | Admitting: *Deleted

## 2020-10-20 LAB — CERVICOVAGINAL ANCILLARY ONLY
Bacterial Vaginitis (gardnerella): POSITIVE — AB
Candida Glabrata: NEGATIVE
Candida Vaginitis: POSITIVE — AB
Chlamydia: NEGATIVE
Comment: NEGATIVE
Comment: NEGATIVE
Comment: NEGATIVE
Comment: NEGATIVE
Comment: NEGATIVE
Comment: NORMAL
Neisseria Gonorrhea: NEGATIVE
Trichomonas: NEGATIVE

## 2020-10-20 MED ORDER — METRONIDAZOLE 0.75 % VA GEL: 1.0000 | Freq: Every day | VAGINAL | 0 refills | Status: AC

## 2020-10-20 MED ORDER — FLUCONAZOLE 150 MG PO TABS: ORAL_TABLET | ORAL | 0 refills | Status: DC

## 2020-11-03 ENCOUNTER — Encounter: Payer: Self-pay | Admitting: Obstetrics and Gynecology

## 2020-11-03 NOTE — Telephone Encounter (Signed)
Urine culture 10/16/20 with E. Coli Treated 10/16/20 with generic Bactrim DS bid x 3 days.

## 2020-11-03 NOTE — Telephone Encounter (Signed)
Please schedule an office visit with me. ?

## 2020-11-04 ENCOUNTER — Other Ambulatory Visit: Payer: Self-pay | Admitting: Obstetrics and Gynecology

## 2020-11-04 DIAGNOSIS — R300 Dysuria: Secondary | ICD-10-CM

## 2020-11-05 NOTE — Telephone Encounter (Addendum)
Refill request received for Bactrim DS, Rx denied.  Per review of Epic, 10/16/20 urine culture E coli, tx with Bactrim DS bid x3 days.  OV needed if symptoms not resolved per Dr. Quincy Simmonds  Left detailed message, ok per dpr. Requested return call to schedule OV if symptoms still present.

## 2021-05-06 ENCOUNTER — Ambulatory Visit (INDEPENDENT_AMBULATORY_CARE_PROVIDER_SITE_OTHER): Payer: 59 | Admitting: Nurse Practitioner

## 2021-05-06 ENCOUNTER — Encounter: Payer: Self-pay | Admitting: Nurse Practitioner

## 2021-05-06 ENCOUNTER — Other Ambulatory Visit: Payer: Self-pay

## 2021-05-06 VITALS — BP 110/76 | HR 86

## 2021-05-06 DIAGNOSIS — N898 Other specified noninflammatory disorders of vagina: Secondary | ICD-10-CM | POA: Diagnosis not present

## 2021-05-06 DIAGNOSIS — Z113 Encounter for screening for infections with a predominantly sexual mode of transmission: Secondary | ICD-10-CM

## 2021-05-06 DIAGNOSIS — N760 Acute vaginitis: Secondary | ICD-10-CM | POA: Diagnosis not present

## 2021-05-06 DIAGNOSIS — B9689 Other specified bacterial agents as the cause of diseases classified elsewhere: Secondary | ICD-10-CM

## 2021-05-06 LAB — WET PREP FOR TRICH, YEAST, CLUE

## 2021-05-06 MED ORDER — METRONIDAZOLE 0.75 % VA GEL: 1.0000 | Freq: Every day | VAGINAL | 0 refills | Status: AC

## 2021-05-06 NOTE — Progress Notes (Signed)
   Acute Office Visit  Subjective:    Patient ID: Dana Kidd, female    DOB: November 23, 1995, 23 y.o.   MRN: [id number]   HPI 25 y.o. presents today for vaginal odor. Had some leftover Metrogel at home and took for 2 days and noticed some relief but did not have anymore to finish treatment. Would like STD screening.    Review of Systems  Constitutional: Negative.   Genitourinary:  Negative for vaginal discharge.       Vaginal odor      Objective:    Physical Exam Constitutional:      Appearance: Normal appearance.  Genitourinary:    General: Normal vulva.     Vagina: Vaginal discharge present. No erythema.     Cervix: Normal.    BP 110/76   Pulse 86   LMP 23/05/2021 (Exact Date)   SpO2 98%  Wt Readings from Last 3 Encounters:  10/16/20 143 lb (64.9 kg)  06/10/20 127 lb (57.6 kg)  01/23/19 127 lb (57.6 kg)   Wet prep negative      Assessment & Plan:   Problem List Items Addressed This Visit   None Visit Diagnoses     Bacterial vaginosis    -  Primary   Relevant Medications   metroNIDAZOLE (METROGEL) 0.75 % vaginal gel   Vaginal odor       Relevant Orders   WET PREP FOR [place], YEAST, CLUE   Screening examination for STD (sexually transmitted disease)       Relevant Orders   SURESWAB CT/NG/T. vaginalis   HIV Antibody (routine testing w rflx)   RPR      Plan: Will treat for BV with metrogel x 5 nights. Wet prep likely false negative due to recent use of cream. STD panel pending.      [place], 11:18 AM 05/06/2021

## 2021-05-08 LAB — RPR: RPR Ser Ql: NONREACTIVE

## 2021-05-08 LAB — SURESWAB CT/NG/T. VAGINALIS
C. trachomatis RNA, TMA: NOT DETECTED
N. gonorrhoeae RNA, TMA: NOT DETECTED
Trichomonas vaginalis RNA: NOT DETECTED

## 2021-05-08 LAB — HIV ANTIBODY (ROUTINE TESTING W REFLEX): HIV 1&2 Ab, 4th Generation: NONREACTIVE

## 2021-08-19 ENCOUNTER — Other Ambulatory Visit: Payer: Self-pay | Admitting: Family

## 2021-08-19 DIAGNOSIS — Z0001 Encounter for general adult medical examination with abnormal findings: Secondary | ICD-10-CM | POA: Diagnosis not present

## 2021-08-19 DIAGNOSIS — Z6826 Body mass index (BMI) 26.0-26.9, adult: Secondary | ICD-10-CM | POA: Diagnosis not present

## 2021-08-19 DIAGNOSIS — R591 Generalized enlarged lymph nodes: Secondary | ICD-10-CM | POA: Diagnosis not present

## 2021-08-19 DIAGNOSIS — Z1331 Encounter for screening for depression: Secondary | ICD-10-CM | POA: Diagnosis not present

## 2021-08-19 DIAGNOSIS — E7800 Pure hypercholesterolemia, unspecified: Secondary | ICD-10-CM | POA: Diagnosis not present

## 2021-08-20 ENCOUNTER — Ambulatory Visit
Admission: RE | Admit: 2021-08-20 | Discharge: 2021-08-20 | Disposition: A | Discharge status: Home / Self Care | Payer: BC Managed Care – PPO | Source: Ambulatory Visit | Attending: Family | Admitting: Family

## 2021-08-20 DIAGNOSIS — R591 Generalized enlarged lymph nodes: Secondary | ICD-10-CM

## 2021-08-21 ENCOUNTER — Other Ambulatory Visit: Payer: Self-pay | Admitting: Family

## 2021-08-21 DIAGNOSIS — R591 Generalized enlarged lymph nodes: Secondary | ICD-10-CM

## 2021-08-31 ENCOUNTER — Other Ambulatory Visit: Payer: Self-pay

## 2021-09-09 ENCOUNTER — Other Ambulatory Visit: Payer: Self-pay

## 2021-09-09 ENCOUNTER — Ambulatory Visit
Admission: RE | Admit: 2021-09-09 | Discharge: 2021-09-09 | Disposition: A | Discharge status: Home / Self Care | Payer: BC Managed Care – PPO | Source: Ambulatory Visit | Attending: Family | Admitting: Family

## 2021-09-09 DIAGNOSIS — R591 Generalized enlarged lymph nodes: Secondary | ICD-10-CM

## 2021-09-09 DIAGNOSIS — R590 Localized enlarged lymph nodes: Secondary | ICD-10-CM | POA: Diagnosis not present

## 2021-09-09 MED ORDER — IOPAMIDOL (ISOVUE-300) INJECTION 61%
75.0000 mL | Freq: Once | INTRAVENOUS | Status: AC | PRN
  Administered 2021-09-09: 75 mL via INTRAVENOUS

## 2021-12-01 DIAGNOSIS — R599 Enlarged lymph nodes, unspecified: Secondary | ICD-10-CM

## 2021-12-01 HISTORY — DX: Enlarged lymph nodes, unspecified: R59.9

## 2022-01-13 ENCOUNTER — Encounter: Payer: Self-pay | Admitting: Nurse Practitioner

## 2022-01-13 ENCOUNTER — Ambulatory Visit (INDEPENDENT_AMBULATORY_CARE_PROVIDER_SITE_OTHER): Payer: 59 | Admitting: Nurse Practitioner

## 2022-01-13 ENCOUNTER — Other Ambulatory Visit (HOSPITAL_COMMUNITY)
Admission: RE | Admit: 2022-01-13 | Discharge: 2022-01-13 | Disposition: A | Discharge status: Home / Self Care | Payer: BC Managed Care – PPO | Source: Ambulatory Visit | Attending: Nurse Practitioner | Admitting: Nurse Practitioner

## 2022-01-13 ENCOUNTER — Ambulatory Visit: Payer: 59 | Admitting: Nurse Practitioner

## 2022-01-13 VITALS — BP 100/62 | HR 77 | Ht 62.0 in | Wt 154.0 lb

## 2022-01-13 DIAGNOSIS — Z113 Encounter for screening for infections with a predominantly sexual mode of transmission: Secondary | ICD-10-CM | POA: Insufficient documentation

## 2022-01-13 DIAGNOSIS — Z30431 Encounter for routine checking of intrauterine contraceptive device: Secondary | ICD-10-CM

## 2022-01-13 DIAGNOSIS — N898 Other specified noninflammatory disorders of vagina: Secondary | ICD-10-CM

## 2022-01-13 DIAGNOSIS — B3731 Acute candidiasis of vulva and vagina: Secondary | ICD-10-CM

## 2022-01-13 DIAGNOSIS — R5383 Other fatigue: Secondary | ICD-10-CM | POA: Diagnosis not present

## 2022-01-13 DIAGNOSIS — Z01419 Encounter for gynecological examination (general) (routine) without abnormal findings: Secondary | ICD-10-CM | POA: Diagnosis not present

## 2022-01-13 LAB — WET PREP FOR TRICH, YEAST, CLUE

## 2022-01-13 MED ORDER — FLUCONAZOLE 150 MG PO TABS: 150.0000 mg | ORAL_TABLET | ORAL | 0 refills | Status: DC

## 2022-01-13 NOTE — Progress Notes (Deleted)
   Dana Kidd May 09, 1996 [id number]   History:  26 y.o. G0 presents for annual exam. Dana Kidd IUD 06/2020. Normal pap history. Left breast fibroadenoma 2020.   Gynecologic History No LMP recorded.   Contraception/Family planning: IUD Sexually active: ***  Health Maintenance Last Pap: 01/23/2019. Results were: Normal Last mammogram: Not indicated Last colonoscopy: Not indicated Last Dexa: Not indicated  Past medical history, past surgical history, family history and social history were all reviewed and documented in the EPIC chart.  ROS:  A ROS was performed and pertinent positives and negatives are included.  Exam:  There were no vitals filed for this visit. There is no height or weight on file to calculate BMI.  General appearance:  Normal Thyroid:  Symmetrical, normal in size, without palpable masses or nodularity. Respiratory  Auscultation:  Clear without wheezing or rhonchi Cardiovascular  Auscultation:  Regular rate, without rubs, murmurs or gallops  Edema/varicosities:  Not grossly evident Abdominal  Soft,nontender, without masses, guarding or rebound.  Liver/spleen:  No organomegaly noted  Hernia:  None appreciated  Skin  Inspection:  Grossly normal Breasts: Examined lying and sitting.   Right: Without masses, retractions, nipple discharge or axillary adenopathy.   Left: Without masses, retractions, nipple discharge or axillary adenopathy. Genitourinary   Inguinal/mons:  Normal without inguinal adenopathy  External genitalia:  Normal appearing vulva with no masses, tenderness, or lesions  BUS/Urethra/Skene's glands:  Normal  Vagina:  Normal appearing with normal color and discharge, no lesions  Cervix:  Normal appearing without discharge or lesions  Uterus:  Normal in size, shape and contour.  Midline and mobile, nontender  Adnexa/parametria:     Rt: Normal in size, without masses or tenderness.   Lt: Normal in size, without masses or tenderness.  Anus  and perineum: Normal  Digital rectal exam: Not indicated  Patient informed chaperone available to be present for breast and pelvic exam. Patient has requested no chaperone to be present. Patient has been advised what will be completed during breast and pelvic exam.   Assessment/Plan:  26 y.o. G0 for annual exam.    Return in 1 year for annual.   Dana Gammon DNP, 12:39 PM 01/13/2022

## 2022-01-13 NOTE — Progress Notes (Signed)
   Dana Kidd 04-Jan-1996 [id number]   History:  26 y.o. G0 presents for annual exam. Complains of daytime sleepiness. Reports restful sleep and even takes naps during the day sometimes. Hours of sleep vary. [name] IUD 06/2020, occasional spotting. Normal pap history. + chlamydia 05/2020.   Gynecologic History No LMP recorded. (Menstrual status: IUD).   Contraception/Family planning: IUD Sexually active: Yes  Health Maintenance Last Pap: 01/23/2019. Results were: Normal Last mammogram (left diagnostic): 02/20/2019. Results were: Left breast fibroadenoma Last colonoscopy: Not indicated Last Dexa: Not indicated  Past medical history, past surgical history, family history and social history were all reviewed and documented in the EPIC chart. 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  ROS:  A ROS was performed and pertinent positives and negatives are included.  Exam:  Vitals:   24/02/23 1433  BP: 100/62  Pulse: 77  SpO2: 100%  Weight: 154 lb (69.9 kg)  Height: '5\' 2"'$  (1.575 m)   Body mass index is 28.17 kg/m.  General appearance:  Normal Thyroid:  Symmetrical, normal in size, without palpable masses or nodularity. Respiratory  Auscultation:  Clear without wheezing or rhonchi Cardiovascular  Auscultation:  Regular rate, without rubs, murmurs or gallops  Edema/varicosities:  Not grossly evident Abdominal  Soft,nontender, without masses, guarding or rebound.  Liver/spleen:  No organomegaly noted  Hernia:  None appreciated  Skin  Inspection:  Grossly normal Breasts: Examined lying and sitting.   Right: Without masses, retractions, nipple discharge or axillary adenopathy.   Left: Without masses, retractions, nipple discharge or axillary adenopathy. Genitourinary   Inguinal/mons:  Normal without inguinal adenopathy  External genitalia:  Normal appearing vulva with no masses, tenderness, or lesions  BUS/Urethra/Skene's glands:  Normal  Vagina:  Vaginal discharge, no  erythema  Cervix:  Normal appearing without discharge or lesions. IUD string visible  Uterus:  Normal in size, shape and contour.  Midline and mobile, nontender  Adnexa/parametria:     Rt: Normal in size, without masses or tenderness.   Lt: Normal in size, without masses or tenderness.  Anus and perineum: Normal  Digital rectal exam: Not indicated  Patient informed chaperone available to be present for breast and pelvic exam. Patient has requested no chaperone to be present. Patient has been advised what will be completed during breast and pelvic exam.   Wet prep + yeast  Assessment/Plan:  26 y.o. G0 for annual exam.   Well female exam with routine gynecological exam - Plan: Cytology - PAP( [place]). Education provided on SBEs, importance of preventative screenings, current guidelines, high calcium diet, regular exercise, and multivitamin daily.   Encounter for routine checking of intrauterine contraceptive device (IUD) - [name] IUD 06/2020, occasional spotting. IUD string visible on exam.   Fatigue, unspecified type - Plan: Vitamin B12, VITAMIN D 25 Hydroxy (Vit-D Deficiency, Fractures), TSH. Daytime sleepiness. Reports restful sleep and even takes naps during the day. Hours of sleep vary.   Screen for STD (sexually transmitted disease) - Plan: Cytology - PAP( [place]), RPR, HIV Antibody (routine testing w rflx). GC, chlamydia added to pap.   Vaginal discharge - Plan: [place], CLUE. Wet prep positive for yeast.   Vaginal candidiasis - Plan: fluconazole (DIFLUCAN) 150 MG tablet today and repeat in 3 days for total of 2 doses.   Screening for cervical cancer - Normal Pap history.  Pap today.   Return in 1 year for annual.     Tamela Gammon DNP, 3:11 PM 01/13/2022

## 2022-01-14 LAB — VITAMIN B12: Vitamin B-12: 263 pg/mL (ref 200–1100)

## 2022-01-14 LAB — TSH: TSH: 1.55 mIU/L

## 2022-01-14 LAB — RPR: RPR Ser Ql: NONREACTIVE

## 2022-01-14 LAB — HIV ANTIBODY (ROUTINE TESTING W REFLEX): HIV 1&2 Ab, 4th Generation: NONREACTIVE

## 2022-01-14 LAB — VITAMIN D 25 HYDROXY (VIT D DEFICIENCY, FRACTURES): Vit D, 25-Hydroxy: 43 ng/mL (ref 30–100)

## 2022-01-18 LAB — CYTOLOGY - PAP
Chlamydia: NEGATIVE
Comment: NEGATIVE
Comment: NORMAL
Neisseria Gonorrhea: NEGATIVE

## 2022-01-19 ENCOUNTER — Other Ambulatory Visit: Payer: Self-pay

## 2022-01-19 DIAGNOSIS — R87612 Low grade squamous intraepithelial lesion on cytologic smear of cervix (LGSIL): Secondary | ICD-10-CM

## 2022-02-17 NOTE — Progress Notes (Signed)
  Subjective:     Patient ID: Dana Kidd, female   DOB: [birth year], 26 y.o.   MRN: [id number]  HPI Patient here today for colposcopy with pap 01-13-22 LSIL;a few cells suggestive of HSIL.  Normal pap history.  Review of Systems  All other systems reviewed and are negative.  LMP: IUD Contraception: [name] IUD 06/2020 UPT:  neg Gardasil:       Objective:   Physical Exam Genitourinary:     Colposcopy - cervix, vagina,. Consent for procedure.  3% acetic acid used in vagina . White light and green light filter used.  Colposcopy satisfactory:  Yes   ___x__          No    _____ Findings:    Cervix:  Mosacis at 12:00 just inside the os.  Vagina:  no lesions.  Biopsies:   ECC, and biopsy at 12:00.  Hibiclens prep to cervix and tenaculum used on anterior cervical lip to obtain biopsy with Tischler at 12:00 on cervix.  Monsel's placed.  Minimal EBL. No complications. 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  [name] IUD.     Plan:     Abnormal paps, HPV, colpo discussed.  Fu biopsy results.  I recommend completion of the Gardasil vaccine series.  She declines vaccination today. Final plan to follow.

## 2022-02-18 ENCOUNTER — Ambulatory Visit (INDEPENDENT_AMBULATORY_CARE_PROVIDER_SITE_OTHER): Payer: 59 | Admitting: Obstetrics and Gynecology

## 2022-02-18 ENCOUNTER — Encounter: Payer: Self-pay | Admitting: Obstetrics and Gynecology

## 2022-02-18 ENCOUNTER — Other Ambulatory Visit (HOSPITAL_COMMUNITY)
Admission: RE | Admit: 2022-02-18 | Discharge: 2022-02-18 | Disposition: A | Discharge status: Home / Self Care | Payer: 59 | Source: Ambulatory Visit | Attending: Obstetrics and Gynecology | Admitting: Obstetrics and Gynecology

## 2022-02-18 VITALS — BP 100/60 | HR 104 | Ht 62.0 in | Wt 154.0 lb

## 2022-02-18 DIAGNOSIS — R87612 Low grade squamous intraepithelial lesion on cytologic smear of cervix (LGSIL): Secondary | ICD-10-CM | POA: Insufficient documentation

## 2022-02-18 DIAGNOSIS — Z01812 Encounter for preprocedural laboratory examination: Secondary | ICD-10-CM

## 2022-02-18 LAB — PREGNANCY, URINE: Preg Test, Ur: NEGATIVE

## 2022-02-18 NOTE — Patient Instructions (Signed)
Colposcopy, Care After  The following information offers guidance on how to care for yourself after your procedure. Your health care provider may also give you more specific instructions. If you have problems or questions, contact your health care provider. What can I expect after the procedure? If you had a colposcopy without a biopsy, you can expect to feel fine right away after your procedure. However, you may have some spotting of blood for a few days. You can return to your normal activities. If you had a colposcopy with a biopsy, it is common after the procedure to have: Soreness and mild pain. These may last for a few days. Mild vaginal bleeding or discharge that is dark-colored and grainy. This may last for a few days. The discharge may be caused by a liquid (solution) that was used during the procedure. You may need to wear a sanitary pad during this time. Spotting of blood for at least 48 hours after the procedure. Follow these instructions at home: Medicines Take over-the-counter and prescription medicines only as told by your health care provider. Talk with your health care provider about what type of over-the-counter pain medicines and prescription medicines you can start to take again. It is especially important to talk with your health care provider if you take blood thinners. Activity Avoid using douche products, using tampons, and having sex for at least 3 days after the procedure or for as long as told by your health care provider. Return to your normal activities as told by your health care provider. Ask your health care provider what activities are safe for you. General instructions Ask your health care provider if you may take baths, swim, or use a hot tub. You may take showers. If you use birth control (contraception), continue to use it. Keep all follow-up visits. This is important. Contact a health care provider if: You have a fever or chills. You faint or feel  light-headed. Get help right away if: You have heavy bleeding from your vagina or pass blood clots. Heavy bleeding is bleeding that soaks through a sanitary pad in less than 1 hour. You have vaginal discharge that is abnormal, is yellow in color, or smells bad. This could be a sign of infection. You have severe pain or cramps in your lower abdomen that do not go away with medicine. Summary If you had a colposcopy without a biopsy, you can expect to feel fine right away, but you may have some spotting of blood for a few days. You can return to your normal activities. If you had a colposcopy with a biopsy, it is common to have mild pain for a few days and spotting for 48 hours after the procedure. Avoid using douche products, using tampons, and having sex for at least 3 days after the procedure or for as long as told by your health care provider. Get help right away if you have heavy bleeding, severe pain, or signs of infection. This information is not intended to replace advice given to you by your health care provider. Make sure you discuss any questions you have with your health care provider. Document Revised: 10/26/2020 Document Reviewed: 10/26/2020 Elsevier Patient Education  2023 [employer]  

## 2022-02-18 NOTE — Addendum Note (Signed)
Addended by: Lowella Fairy on: 02/18/2022 12:14 PM   Modules accepted: Orders

## 2022-02-22 LAB — SURGICAL PATHOLOGY

## 2022-02-23 ENCOUNTER — Other Ambulatory Visit: Payer: Self-pay

## 2022-02-23 DIAGNOSIS — R87612 Low grade squamous intraepithelial lesion on cytologic smear of cervix (LGSIL): Secondary | ICD-10-CM

## 2022-02-24 ENCOUNTER — Ambulatory Visit: Payer: 59

## 2022-03-29 ENCOUNTER — Other Ambulatory Visit: Payer: Self-pay | Admitting: Physician Assistant

## 2022-03-29 DIAGNOSIS — R599 Enlarged lymph nodes, unspecified: Secondary | ICD-10-CM

## 2022-04-06 ENCOUNTER — Ambulatory Visit
Admission: RE | Admit: 2022-04-06 | Discharge: 2022-04-06 | Disposition: A | Discharge status: Home / Self Care | Payer: 59 | Source: Ambulatory Visit | Attending: Physician Assistant | Admitting: Physician Assistant

## 2022-04-06 DIAGNOSIS — R599 Enlarged lymph nodes, unspecified: Secondary | ICD-10-CM

## 2022-04-06 MED ORDER — IOPAMIDOL (ISOVUE-300) INJECTION 61%
75.0000 mL | Freq: Once | INTRAVENOUS | Status: AC | PRN
Start: 2022-04-06 — End: 2022-04-06
  Administered 2022-04-06: 75 mL via INTRAVENOUS

## 2022-06-14 NOTE — L&D Delivery Note (Signed)
Delivery Note  SVD viable female Apgars 8,9 over intact perineum.  Nuchal x 1 reduced on perineum.  Placenta delivered spontaneously intact with 3VC. Good support and hemostasis noted.  R/V exam confirms. Nicki Reaper responded to massage and TXA  Mother and baby to couplet care and are doing well.  EBL 242 cc  Candice Camp, MD

## 2022-07-22 ENCOUNTER — Ambulatory Visit (INDEPENDENT_AMBULATORY_CARE_PROVIDER_SITE_OTHER): Payer: 59

## 2022-07-22 DIAGNOSIS — Z3200 Encounter for pregnancy test, result unknown: Secondary | ICD-10-CM

## 2022-07-22 DIAGNOSIS — Z3201 Encounter for pregnancy test, result positive: Secondary | ICD-10-CM

## 2022-07-22 LAB — POCT URINE PREGNANCY: Preg Test, Ur: POSITIVE — AB

## 2022-07-22 MED ORDER — PRENATE MINI 18-0.6-0.4-350 MG PO CAPS
1.0000 | ORAL_CAPSULE | Freq: Every day | ORAL | 11 refills | Status: AC
Start: 2022-07-22 — End: ?

## 2022-07-22 NOTE — Progress Notes (Signed)
..  Dana Kidd presents today for UPT. She has no unusual complaints. LMP:06/24/2022    OBJECTIVE: Appears well, in no apparent distress.  OB History     Gravida  1   Para  0   Term  0   Preterm  0   AB  0   Living  0      SAB  0   IAB  0   Ectopic  0   Multiple  0   Live Births  0          Home UPT Result:Positive In-Office UPT result:Positive I have reviewed the patient's medical, obstetrical, social, and family histories, and medications.   ASSESSMENT: Positive pregnancy test  PLAN Prenatal care to be completed at: [facility] or [facility] Provided patient with safe med list and sent PNV to pharmacy

## 2022-08-04 ENCOUNTER — Encounter: Payer: Self-pay | Admitting: Obstetrics

## 2022-08-19 ENCOUNTER — Other Ambulatory Visit (HOSPITAL_COMMUNITY)
Admission: RE | Admit: 2022-08-19 | Discharge: 2022-08-19 | Disposition: A | Discharge status: Home / Self Care | Payer: Managed Care, Other (non HMO) | Source: Ambulatory Visit | Attending: Obstetrics | Admitting: Obstetrics

## 2022-08-19 ENCOUNTER — Ambulatory Visit (INDEPENDENT_AMBULATORY_CARE_PROVIDER_SITE_OTHER): Payer: Managed Care, Other (non HMO) | Admitting: *Deleted

## 2022-08-19 ENCOUNTER — Ambulatory Visit (INDEPENDENT_AMBULATORY_CARE_PROVIDER_SITE_OTHER): Payer: Managed Care, Other (non HMO)

## 2022-08-19 VITALS — BP 123/83 | HR 99 | Ht 62.5 in | Wt 157.5 lb

## 2022-08-19 DIAGNOSIS — Z3480 Encounter for supervision of other normal pregnancy, unspecified trimester: Secondary | ICD-10-CM

## 2022-08-19 DIAGNOSIS — O3680X1 Pregnancy with inconclusive fetal viability, fetus 1: Secondary | ICD-10-CM | POA: Diagnosis not present

## 2022-08-19 DIAGNOSIS — Z1339 Encounter for screening examination for other mental health and behavioral disorders: Secondary | ICD-10-CM | POA: Diagnosis not present

## 2022-08-19 DIAGNOSIS — Z3A01 Less than 8 weeks gestation of pregnancy: Secondary | ICD-10-CM | POA: Diagnosis not present

## 2022-08-19 DIAGNOSIS — R87612 Low grade squamous intraepithelial lesion on cytologic smear of cervix (LGSIL): Secondary | ICD-10-CM

## 2022-08-19 DIAGNOSIS — R350 Frequency of micturition: Secondary | ICD-10-CM

## 2022-08-19 LAB — POCT URINALYSIS DIPSTICK
Appearance: NORMAL
Bilirubin, UA: NEGATIVE
Blood, UA: NEGATIVE
Glucose, UA: POSITIVE — AB
Leukocytes, UA: NEGATIVE
Nitrite, UA: NEGATIVE
Odor: NORMAL
Protein, UA: NEGATIVE
Spec Grav, UA: <=1.005 — AB (ref 1.010–1.025)
Urobilinogen, UA: 0.2 E.U./dL
pH, UA: 5 (ref 5.0–8.0)

## 2022-08-19 MED ORDER — DOXYLAMINE-PYRIDOXINE 10-10 MG PO TBEC: 2.0000 | DELAYED_RELEASE_TABLET | Freq: Every day | ORAL | 5 refills | Status: DC

## 2022-08-19 NOTE — Progress Notes (Addendum)
New OB Intake  I connected with Dana Kidd  on 08/19/22 at  8:15 AM EST by In Person Visit and verified that I am speaking with the correct person using two identifiers. Nurse is located at [facility] and pt is located at [facility].  I discussed the limitations, risks, security and privacy concerns of performing an evaluation and management service by telephone and the availability of in person appointments. I also discussed with the patient that there may be a patient responsible charge related to this service. The patient expressed understanding and agreed to proceed.  I explained I am completing New OB Intake today. We discussed EDD of 03/31/23 that is based on LMP of 06/24/22. Pt is G1/P0. I reviewed her allergies, medications, Medical/Surgical/OB history, and appropriate screenings. I informed her of [facility] services. [facility] information placed in AVS. Based on history, this is a low risk pregnancy.  There are no problems to display for this patient.   Concerns addressed today  Delivery Plans Plans to deliver at [facility]. Patient given information for [facility] Healthy Baby website for more information about Women's and [place]. Patient is not interested in water birth. Offered upcoming OB visit with CNM to discuss further.  MyChart/Babyscripts MyChart access verified. I explained pt will have some visits in office and some virtually. Babyscripts instructions given and order placed. Patient verifies receipt of registration text/e-mail. Account successfully created and app downloaded.  Blood Pressure Cuff/Weight Scale Pt has access to BP cuff at work. Explained after first prenatal appt pt will check weekly and document in 68. Patient does not have weight scale; patient may purchase if they desire to track weight weekly in Babyscripts.  Anatomy US Explained first scheduled [place] will be around 19 weeks. Anatomy US scheduled for 19wks at MFM. Pt notified to arrive at  TBD.  Labs Discussed Johnsie Cancel genetic screening with patient. Would like both Panorama and Horizon drawn at new OB visit. Routine prenatal labs needed.  COVID Vaccine Patient has not had COVID vaccine.  Social Determinants of Health Food Insecurity: Patient denies food insecurity. WIC Referral: Patient is not interested in referral to [facility].  Transportation: Patient denies transportation needs. Childcare: Discussed no children allowed at ultrasound appointments. Offered childcare services; patient declines childcare services at this time.  Interested in [place]? If yes, send referral.   First visit review I reviewed new OB appt with patient. I explained they will have a provider visit that includes genetic screening labs and discussion with provider. Explained pt will be seen by Dr. Jodi Mourning at first visit; encounter routed to appropriate provider. Explained that patient will be seen by pregnancy navigator following visit with provider.   Penny Pia, RN 08/19/2022  8:15 AM

## 2022-08-20 DIAGNOSIS — R87612 Low grade squamous intraepithelial lesion on cytologic smear of cervix (LGSIL): Secondary | ICD-10-CM | POA: Insufficient documentation

## 2022-08-20 LAB — CBC/D/PLT+RPR+RH+ABO+RUBIGG...
Antibody Screen: NEGATIVE
Basophils Absolute: 0.1 10*3/uL (ref 0.0–0.2)
Basos: 1 %
EOS (ABSOLUTE): 0 10*3/uL (ref 0.0–0.4)
Eos: 0 %
HCV Ab: NONREACTIVE
HIV Screen 4th Generation wRfx: NONREACTIVE
Hematocrit: 40.1 % (ref 34.0–46.6)
Hemoglobin: 13.8 g/dL (ref 11.1–15.9)
Hepatitis B Surface Ag: NEGATIVE
Immature Grans (Abs): 0 10*3/uL (ref 0.0–0.1)
Immature Granulocytes: 0 %
Lymphocytes Absolute: 2.4 10*3/uL (ref 0.7–3.1)
Lymphs: 24 %
MCH: 30.9 pg (ref 26.6–33.0)
MCHC: 34.4 g/dL (ref 31.5–35.7)
MCV: 90 fL (ref 79–97)
Monocytes Absolute: 0.7 10*3/uL (ref 0.1–0.9)
Monocytes: 7 %
Neutrophils Absolute: 7 10*3/uL (ref 1.4–7.0)
Neutrophils: 68 %
Platelets: 333 10*3/uL (ref 150–450)
RBC: 4.47 x10E6/uL (ref 3.77–5.28)
RDW: 12 % (ref 11.7–15.4)
RPR Ser Ql: NONREACTIVE
Rh Factor: POSITIVE
Rubella Antibodies, IGG: 2.87 index (ref >0.99)
WBC: 10.2 10*3/uL (ref 3.4–10.8)

## 2022-08-20 LAB — CERVICOVAGINAL ANCILLARY ONLY
Chlamydia: NEGATIVE
Comment: NEGATIVE
Comment: NORMAL
Neisseria Gonorrhea: NEGATIVE

## 2022-08-20 LAB — HCV INTERPRETATION

## 2022-08-21 LAB — CULTURE, OB URINE

## 2022-08-21 LAB — URINE CULTURE, OB REFLEX

## 2022-08-23 ENCOUNTER — Encounter: Payer: Self-pay | Admitting: Obstetrics

## 2022-09-09 ENCOUNTER — Encounter: Payer: 59 | Admitting: Obstetrics

## 2022-09-13 ENCOUNTER — Other Ambulatory Visit: Payer: Self-pay

## 2022-09-13 ENCOUNTER — Ambulatory Visit (INDEPENDENT_AMBULATORY_CARE_PROVIDER_SITE_OTHER): Payer: Managed Care, Other (non HMO) | Admitting: Obstetrics & Gynecology

## 2022-09-13 ENCOUNTER — Encounter: Payer: Self-pay | Admitting: Obstetrics & Gynecology

## 2022-09-13 VITALS — BP 114/84 | HR 101 | Wt 159.6 lb

## 2022-09-13 DIAGNOSIS — F419 Anxiety disorder, unspecified: Secondary | ICD-10-CM

## 2022-09-13 DIAGNOSIS — Z3A11 11 weeks gestation of pregnancy: Secondary | ICD-10-CM | POA: Diagnosis not present

## 2022-09-13 DIAGNOSIS — O219 Vomiting of pregnancy, unspecified: Secondary | ICD-10-CM | POA: Diagnosis not present

## 2022-09-13 DIAGNOSIS — Z3480 Encounter for supervision of other normal pregnancy, unspecified trimester: Secondary | ICD-10-CM

## 2022-09-13 MED ORDER — PROMETHAZINE HCL 25 MG PO TABS
25.0000 mg | ORAL_TABLET | Freq: Four times a day (QID) | ORAL | 1 refills | Status: DC | PRN
Start: 2022-09-13 — End: 2023-03-26

## 2022-09-13 MED ORDER — ONDANSETRON 4 MG PO TBDP
4.0000 mg | ORAL_TABLET | Freq: Four times a day (QID) | ORAL | 0 refills | Status: DC | PRN
Start: 2022-09-13 — End: 2023-03-26

## 2022-09-13 NOTE — Progress Notes (Signed)
History:   Dana Kidd is a 27 y.o. G1P0000 at [redacted]w[redacted]d by LMP, early ultrasound being seen today for her first obstetrical visit. Patient does intend to breast feed. Pregnancy history fully reviewed.  Patient reports nausea and vomiting.  Desires medication for this.      HISTORY: OB History  Gravida Para Term Preterm AB Living  1 0 0 0 0 0  SAB IAB Ectopic Multiple Live Births  0 0 0 0 0    # Outcome Date GA Lbr Len/2nd Weight Sex Delivery Anes PTL Lv  1 Current             Last pap smear was done 02/2022 and was abnormal - LGSIL .  Repeat in one year recommended.  Past Medical History:  Diagnosis Date   Chlamydia 05/2020   Dysplastic nevus of trunk 07/27/2016   Left breast mass    complicated cyst versus fibroadenoma - followed by ultrasound   Lymph nodes enlarged 12/01/2021   Mental disorder    anxiety   Past Surgical History:  Procedure Laterality Date   broken finger Right 2015   --ring finger   Family History  Problem Relation Age of Onset   Hypertension Paternal Grandfather    Heart Problems Paternal Grandfather    Cancer Paternal Grandmother    Diabetes Maternal Grandmother    Hypertension Maternal Grandmother    Hypertension Maternal Grandfather    Osteoarthritis Maternal Grandfather    Irritable bowel syndrome Mother    Breast cancer Paternal Aunt        early 75's   Social History   Tobacco Use   Smoking status: Never   Smokeless tobacco: Never  Vaping Use   Vaping Use: Never used  Substance Use Topics   Alcohol use: No   Drug use: No   Allergies  Allergen Reactions   Guaifenesin Anaphylaxis    hives hives Other reaction(s): Anaphylaxis hives hives   Current Outpatient Medications on File Prior to Visit  Medication Sig Dispense Refill   Prenat-FeCbn-FeAsp-Meth-FA-DHA (PRENATE MINI) 18-0.6-0.4-350 MG CAPS Take 1 tablet by mouth daily. 30 capsule 11   escitalopram (LEXAPRO) 5 MG tablet Take 1 tablet by mouth daily. (Patient not  taking: Reported on 07/22/2022)     No current facility-administered medications on file prior to visit.   Review of Systems Pertinent items noted in HPI and remainder of comprehensive ROS otherwise negative.  Physical Exam:   Vitals:   09/13/22 1015  BP: 114/84  Pulse: (!) 101  Weight: 159 lb 9.6 oz (72.4 kg)   Fetal Heart Rate (bpm): 160s on u/s   General: well-developed, well-nourished female in no acute distress  Breasts:  deferred  Skin: normal coloration and turgor, no rashes  Neurologic: oriented, normal, negative, normal mood  Extremities: normal strength, tone, and muscle mass, ROM of all joints is normal  HEENT PERRLA, extraocular movement intact and sclera clear, anicteric  Neck supple and no masses  Cardiovascular: regular rate and rhythm  Respiratory:  no respiratory distress, normal breath sounds  Abdomen: soft, non-tender; bowel sounds normal; no masses,  no organomegaly  Pelvic: deferred    Assessment:    Pregnancy: G1P0000 Patient Active Problem List   Diagnosis Date Noted   LGSIL on Pap smear of cervix on 02/18/22 08/20/2022   Supervision of other normal pregnancy, antepartum 08/19/2022   Anxiety 06/19/2015     Plan:    1. Nausea and vomiting during pregnancy Antiemetics prescribed. - ondansetron (ZOFRAN-ODT) 4  MG disintegrating tablet; Take 1 tablet (4 mg total) by mouth every 6 (six) hours as needed for nausea.  Dispense: 20 tablet; Refill: 0 - promethazine (PHENERGAN) 25 MG tablet; Take 1 tablet (25 mg total) by mouth every 6 (six) hours as needed for nausea or vomiting.  Dispense: 30 tablet; Refill: 1  2. Anxiety Was controlled on Lexapro in the past, has not taken in a while. Zoloft did not work for her in the past.  No symptoms now, will continue to monitor.  3. [redacted] weeks gestation of pregnancy 4. Supervision of other normal pregnancy, antepartum Already had some prenatal labs, these were reviewed and were reassuring. Remaining labs drawn today.   - PANORAMA PRENATAL TEST FULL PANEL - HORIZON CUSTOM - Comprehensive metabolic panel - Hemoglobin A1c Continue prenatal vitamins. Problem list reviewed and updated. Genetic Screening discussed, Panorama and Horizon: ordered. Ultrasound discussed; fetal anatomic survey: scheduled. Anticipatory guidance about prenatal visits given including labs, ultrasounds, and testing. Weight gain recommendations per IOM guidelines reviewed: underweight/BMI 18.5 or less > 28 - 40 lbs; normal weight/BMI 18.5 - 24.9 > 25 - 35 lbs; overweight/BMI 25 - 29.9 > 15 - 25 lbs; obese/BMI  30 or more > 11 - 20 lbs. Discussed usage of the Babyscripts app for more information about pregnancy, and to track blood pressures. Also discussed usage of virtual visits as additional source of managing and completing prenatal visits.  Patient was encouraged to use MyChart to review results, send requests, and have questions addressed.   The nature of [facility] for [facility] Healthcare/Faculty Practice with multiple MDs and Advanced Practice Providers was explained to patient; also emphasized that residents, students are part of our team. Routine obstetric precautions reviewed. Encouraged to seek out care at our office or emergency room [facility] preferred) for urgent and/or emergent concerns. Return in about 4 weeks (around 10/11/2022) for OFFICE OB VISIT (MD or APP).     Verita Schneiders, MD, [place] for [employer], [place]

## 2022-09-14 LAB — COMPREHENSIVE METABOLIC PANEL
ALT: 9 IU/L (ref 0–32)
AST: 17 IU/L (ref 0–40)
Albumin/Globulin Ratio: 1.7 (ref 1.2–2.2)
Albumin: 4.1 g/dL (ref 4.0–5.0)
Alkaline Phosphatase: 70 IU/L (ref 44–121)
BUN/Creatinine Ratio: 9 (ref 9–23)
BUN: 5 mg/dL — ABNORMAL LOW (ref 6–20)
Bilirubin Total: 0.3 mg/dL (ref 0.0–1.2)
CO2: 17 mmol/L — ABNORMAL LOW (ref 20–29)
Calcium: 8.8 mg/dL (ref 8.7–10.2)
Chloride: 103 mmol/L (ref 96–106)
Creatinine, Ser: 0.56 mg/dL — ABNORMAL LOW (ref 0.57–1.00)
Globulin, Total: 2.4 g/dL (ref 1.5–4.5)
Glucose: 83 mg/dL (ref 70–99)
Potassium: 4.4 mmol/L (ref 3.5–5.2)
Sodium: 136 mmol/L (ref 134–144)
Total Protein: 6.5 g/dL (ref 6.0–8.5)
eGFR: 131 mL/min/{1.73_m2} (ref >59)

## 2022-09-14 LAB — HEMOGLOBIN A1C
Est. average glucose Bld gHb Est-mCnc: 94 mg/dL
Hgb A1c MFr Bld: 4.9 % (ref 4.8–5.6)

## 2022-09-19 LAB — PANORAMA PRENATAL TEST FULL PANEL:PANORAMA TEST PLUS 5 ADDITIONAL MICRODELETIONS: FETAL FRACTION: 14

## 2022-09-19 LAB — HORIZON CUSTOM: REPORT SUMMARY: NEGATIVE

## 2022-10-04 ENCOUNTER — Encounter: Payer: Self-pay | Admitting: Obstetrics & Gynecology

## 2022-10-11 ENCOUNTER — Other Ambulatory Visit: Payer: Self-pay

## 2022-10-11 ENCOUNTER — Ambulatory Visit (INDEPENDENT_AMBULATORY_CARE_PROVIDER_SITE_OTHER): Payer: Managed Care, Other (non HMO) | Admitting: Obstetrics and Gynecology

## 2022-10-11 VITALS — BP 107/73 | HR 102 | Wt 162.0 lb

## 2022-10-11 DIAGNOSIS — Z3482 Encounter for supervision of other normal pregnancy, second trimester: Secondary | ICD-10-CM

## 2022-10-11 DIAGNOSIS — Z3A15 15 weeks gestation of pregnancy: Secondary | ICD-10-CM

## 2022-10-11 DIAGNOSIS — Z3480 Encounter for supervision of other normal pregnancy, unspecified trimester: Secondary | ICD-10-CM

## 2022-10-11 DIAGNOSIS — L237 Allergic contact dermatitis due to plants, except food: Secondary | ICD-10-CM

## 2022-10-11 MED ORDER — PREDNISONE 20 MG PO TABS: 20.0000 mg | ORAL_TABLET | Freq: Every day | ORAL | 0 refills | Status: AC

## 2022-10-11 NOTE — Progress Notes (Signed)
   PRENATAL VISIT NOTE  Subjective:  Dana Kidd is a 27 y.o. G1P0000 at [redacted]w[redacted]d being seen today for ongoing prenatal care.  She is currently monitored for the following issues for this low-risk pregnancy and has Supervision of other normal pregnancy, antepartum; LGSIL on Pap smear of cervix on 02/18/22; and Anxiety on their problem list.  Patient reports  persistent poison ivy dermatitis. Tried triamsinolone cream for over a week and benadryl but no help. She states she is highly sensitive to it and usually a shot will resolve it but UC uncomfortable doing anything aside from topical steroids .  Contractions: Not present. Vag. Bleeding: None.  Movement: Absent. Denies leaking of fluid.   The following portions of the patient's history were reviewed and updated as appropriate: allergies, current medications, past family history, past medical history, past social history, past surgical history and problem list.   Objective:   Vitals:   10/11/22 0827  BP: 107/73  Pulse: (!) 102  Weight: 162 lb (73.5 kg)    Fetal Status: Fetal Heart Rate (bpm): 152   Movement: Absent     General:  Alert, oriented and cooperative. Patient is in no acute distress.  Skin: Skin is warm and dry. No rash noted.   Cardiovascular: Normal heart rate noted  Respiratory: Normal respiratory effort, no problems with respiration noted  Abdomen: Soft, gravid, appropriate for gestational age.  Pain/Pressure: Present     Pelvic:         Extremities: Normal range of motion.  Edema: None  Mental Status: Normal mood and affect. Normal behavior. Normal judgment and thought content.   Assessment and Plan:  Pregnancy: G1P0000 at [redacted]w[redacted]d 1. Supervision of other normal pregnancy, antepartum Pt amenable to afp screening. F/u mfm u/s - AFP, Serum, Open Spina Bifida  2. [redacted] weeks gestation of pregnancy  3. Poison ivy dermatitis Pt states she's washed everything in contact with the ivy, etc.  Will start 5day course of  prednisone 20 qday  Preterm labor symptoms and general obstetric precautions including but not limited to vaginal bleeding, contractions, leaking of fluid and fetal movement were reviewed in detail with the patient. Please refer to After Visit Summary for other counseling recommendations.   Return in about 24 days (around 11/04/2022) for in person, low risk ob, md or app.  Future Appointments  Date Time Provider Department Center  11/04/2022  9:45 AM [facility] US5 [facility]  02/23/2023 10:00 AM [place] Shirley Friar, MD [facility] None    [place] Bing, MD

## 2022-10-14 LAB — AFP, SERUM, OPEN SPINA BIFIDA
AFP MoM: 0.74
AFP Value: 20.7 ng/mL
Gest. Age on Collection Date: 15 weeks
Maternal Age At EDD: 25.5 yr
OSBR Risk 1 IN: 10000
Test Results:: NEGATIVE
Weight: 162 [lb_av]

## 2022-10-22 ENCOUNTER — Telehealth: Payer: Self-pay

## 2022-10-22 NOTE — Telephone Encounter (Signed)
VM left by Marylene Land with Medical Records at [facility]. Requesting AFP result. All other records were received but this had not yet resulted. Requests this faxed to [phone].

## 2022-11-04 ENCOUNTER — Ambulatory Visit: Payer: Managed Care, Other (non HMO)

## 2022-11-05 ENCOUNTER — Encounter: Payer: Managed Care, Other (non HMO) | Admitting: Family Medicine

## 2023-02-09 ENCOUNTER — Telehealth: Payer: Self-pay | Admitting: Obstetrics and Gynecology

## 2023-02-09 NOTE — Telephone Encounter (Signed)
Please contact patient in preparation for upcoming colposcopy scheduled with me for 02/23/23.   If patient is receiving pregnancy care, her cervical cancer screening or colposcopy would need to be performed by her OB team.

## 2023-02-10 NOTE — Telephone Encounter (Signed)
Call placed to patient, left detailed message, ok per dpr. Advised per Dr. Edward Jolly, requested return call to provide update at [phone], opt 4.

## 2023-02-16 NOTE — Telephone Encounter (Signed)
Reason for cancellation: Canceled via MyChart    Patient comments: Currently pregnant. My ob is going to do this after  I give birth   Patient cancelled colpo appointment via MyChart on 02-16-23.

## 2023-02-23 ENCOUNTER — Ambulatory Visit: Payer: 59 | Admitting: Obstetrics and Gynecology

## 2023-03-10 LAB — OB RESULTS CONSOLE GBS: GBS: NEGATIVE

## 2023-03-24 ENCOUNTER — Inpatient Hospital Stay (HOSPITAL_COMMUNITY)
Admission: AD | Admit: 2023-03-24 | Discharge: 2023-03-26 | DRG: 807 | Disposition: A | Discharge status: Home / Self Care | Payer: 59 | Attending: Obstetrics and Gynecology | Admitting: Obstetrics and Gynecology

## 2023-03-24 ENCOUNTER — Encounter (HOSPITAL_COMMUNITY): Payer: Self-pay | Admitting: Obstetrics and Gynecology

## 2023-03-24 ENCOUNTER — Inpatient Hospital Stay (HOSPITAL_COMMUNITY): Payer: 59 | Admitting: Anesthesiology

## 2023-03-24 ENCOUNTER — Other Ambulatory Visit: Payer: Self-pay

## 2023-03-24 DIAGNOSIS — Z3490 Encounter for supervision of normal pregnancy, unspecified, unspecified trimester: Principal | ICD-10-CM

## 2023-03-24 DIAGNOSIS — Z833 Family history of diabetes mellitus: Secondary | ICD-10-CM | POA: Diagnosis not present

## 2023-03-24 DIAGNOSIS — F419 Anxiety disorder, unspecified: Secondary | ICD-10-CM | POA: Diagnosis present

## 2023-03-24 DIAGNOSIS — Z8249 Family history of ischemic heart disease and other diseases of the circulatory system: Secondary | ICD-10-CM

## 2023-03-24 DIAGNOSIS — Z3A39 39 weeks gestation of pregnancy: Secondary | ICD-10-CM

## 2023-03-24 DIAGNOSIS — O26893 Other specified pregnancy related conditions, third trimester: Secondary | ICD-10-CM | POA: Diagnosis present

## 2023-03-24 DIAGNOSIS — O99344 Other mental disorders complicating childbirth: Secondary | ICD-10-CM | POA: Diagnosis present

## 2023-03-24 DIAGNOSIS — Z803 Family history of malignant neoplasm of breast: Secondary | ICD-10-CM

## 2023-03-24 HISTORY — DX: Anxiety disorder, unspecified: F41.9

## 2023-03-24 LAB — CBC
HCT: 37.9 % (ref 36.0–46.0)
Hemoglobin: 13.1 g/dL (ref 12.0–15.0)
MCH: 30.3 pg (ref 26.0–34.0)
MCHC: 34.6 g/dL (ref 30.0–36.0)
MCV: 87.5 fL (ref 80.0–100.0)
Platelets: 230 10*3/uL (ref 150–400)
RBC: 4.33 MIL/uL (ref 3.87–5.11)
RDW: 12.8 % (ref 11.5–15.5)
WBC: 9.4 10*3/uL (ref 4.0–10.5)
nRBC: 0 % (ref 0.0–0.2)

## 2023-03-24 LAB — TYPE AND SCREEN
ABO/RH(D): B POS
Antibody Screen: NEGATIVE

## 2023-03-24 LAB — RPR: RPR Ser Ql: NONREACTIVE

## 2023-03-24 MED ORDER — BENZOCAINE-MENTHOL 20-0.5 % EX AERO
1.0000 | INHALATION_SPRAY | CUTANEOUS | Status: DC | PRN
  Administered 2023-03-25: 1 via TOPICAL
  Filled 2023-03-24 (×2): qty 56

## 2023-03-24 MED ORDER — OXYCODONE-ACETAMINOPHEN 5-325 MG PO TABS: 2.0000 | ORAL_TABLET | ORAL | Status: DC | PRN

## 2023-03-24 MED ORDER — SENNOSIDES-DOCUSATE SODIUM 8.6-50 MG PO TABS
2.0000 | ORAL_TABLET | Freq: Every day | ORAL | Status: DC
  Administered 2023-03-25 – 2023-03-26 (×2): 2 via ORAL
  Filled 2023-03-24 (×2): qty 2

## 2023-03-24 MED ORDER — TERBUTALINE SULFATE 1 MG/ML IJ SOLN: 0.2500 mg | Freq: Once | INTRAMUSCULAR | Status: DC | PRN

## 2023-03-24 MED ORDER — OXYTOCIN-SODIUM CHLORIDE 30-0.9 UT/500ML-% IV SOLN
1.0000 m[IU]/min | INTRAVENOUS | Status: DC
  Administered 2023-03-24: 2 m[IU]/min via INTRAVENOUS

## 2023-03-24 MED ORDER — ONDANSETRON 4 MG PO TBDP: 4.0000 mg | ORAL_TABLET | Freq: Four times a day (QID) | ORAL | Status: DC | PRN

## 2023-03-24 MED ORDER — OXYCODONE-ACETAMINOPHEN 5-325 MG PO TABS: 1.0000 | ORAL_TABLET | ORAL | Status: DC | PRN

## 2023-03-24 MED ORDER — DIPHENHYDRAMINE HCL 25 MG PO CAPS: 25.0000 mg | ORAL_CAPSULE | Freq: Four times a day (QID) | ORAL | Status: DC | PRN

## 2023-03-24 MED ORDER — LIDOCAINE HCL (PF) 1 % IJ SOLN: 30.0000 mL | INTRAMUSCULAR | Status: DC | PRN

## 2023-03-24 MED ORDER — DIBUCAINE (PERIANAL) 1 % EX OINT: 1.0000 | TOPICAL_OINTMENT | CUTANEOUS | Status: DC | PRN

## 2023-03-24 MED ORDER — COCONUT OIL OIL
1.0000 | TOPICAL_OIL | Status: DC | PRN
  Administered 2023-03-25: 1 via TOPICAL

## 2023-03-24 MED ORDER — SIMETHICONE 80 MG PO CHEW: 80.0000 mg | CHEWABLE_TABLET | ORAL | Status: DC | PRN

## 2023-03-24 MED ORDER — EPHEDRINE 5 MG/ML INJ: 10.0000 mg | INTRAVENOUS | Status: DC | PRN

## 2023-03-24 MED ORDER — FENTANYL-BUPIVACAINE-NACL 0.5-0.125-0.9 MG/250ML-% EP SOLN
12.0000 mL/h | EPIDURAL | Status: DC | PRN
  Administered 2023-03-24: 12 mL/h via EPIDURAL
  Filled 2023-03-24: qty 250

## 2023-03-24 MED ORDER — ONDANSETRON HCL 4 MG/2ML IJ SOLN: 4.0000 mg | INTRAMUSCULAR | Status: DC | PRN

## 2023-03-24 MED ORDER — TRANEXAMIC ACID-NACL 1000-0.7 MG/100ML-% IV SOLN
INTRAVENOUS | Status: AC
  Filled 2023-03-24: qty 100

## 2023-03-24 MED ORDER — TRANEXAMIC ACID-NACL 1000-0.7 MG/100ML-% IV SOLN
1000.0000 mg | INTRAVENOUS | Status: AC
  Administered 2023-03-24: 1000 mg via INTRAVENOUS

## 2023-03-24 MED ORDER — PHENYLEPHRINE 80 MCG/ML (10ML) SYRINGE FOR IV PUSH (FOR BLOOD PRESSURE SUPPORT): 80.0000 ug | PREFILLED_SYRINGE | INTRAVENOUS | Status: DC | PRN

## 2023-03-24 MED ORDER — LIDOCAINE-EPINEPHRINE (PF) 1.5 %-1:200000 IJ SOLN
INTRAMUSCULAR | Status: DC | PRN
Start: 2023-03-24 — End: 2023-03-24
  Administered 2023-03-24: 5 mL via EPIDURAL

## 2023-03-24 MED ORDER — MEASLES, MUMPS & RUBELLA VAC IJ SOLR: 0.5000 mL | Freq: Once | INTRAMUSCULAR | Status: DC

## 2023-03-24 MED ORDER — DIPHENHYDRAMINE HCL 50 MG/ML IJ SOLN: 12.5000 mg | INTRAMUSCULAR | Status: DC | PRN

## 2023-03-24 MED ORDER — PRENATAL MULTIVITAMIN CH
1.0000 | ORAL_TABLET | Freq: Every day | ORAL | Status: DC
  Administered 2023-03-25: 1 via ORAL
  Filled 2023-03-24 (×2): qty 1

## 2023-03-24 MED ORDER — SODIUM CHLORIDE 0.9 % IV SOLN: 250.0000 mL | INTRAVENOUS | Status: DC | PRN

## 2023-03-24 MED ORDER — WITCH HAZEL-GLYCERIN EX PADS: 1.0000 | MEDICATED_PAD | CUTANEOUS | Status: DC | PRN

## 2023-03-24 MED ORDER — LACTATED RINGERS IV SOLN: INTRAVENOUS | Status: DC

## 2023-03-24 MED ORDER — SODIUM CHLORIDE 0.9% FLUSH: 3.0000 mL | Freq: Two times a day (BID) | INTRAVENOUS | Status: DC

## 2023-03-24 MED ORDER — FENTANYL CITRATE (PF) 100 MCG/2ML IJ SOLN: 50.0000 ug | INTRAMUSCULAR | Status: DC | PRN

## 2023-03-24 MED ORDER — SODIUM CHLORIDE 0.9% FLUSH: 3.0000 mL | INTRAVENOUS | Status: DC | PRN

## 2023-03-24 MED ORDER — ONDANSETRON HCL 4 MG PO TABS: 4.0000 mg | ORAL_TABLET | ORAL | Status: DC | PRN

## 2023-03-24 MED ORDER — ACETAMINOPHEN 325 MG PO TABS
650.0000 mg | ORAL_TABLET | ORAL | Status: DC | PRN
  Administered 2023-03-24 (×2): 650 mg via ORAL
  Filled 2023-03-24 (×2): qty 2

## 2023-03-24 MED ORDER — TETANUS-DIPHTH-ACELL PERTUSSIS 5-2.5-18.5 LF-MCG/0.5 IM SUSY: 0.5000 mL | PREFILLED_SYRINGE | Freq: Once | INTRAMUSCULAR | Status: DC

## 2023-03-24 MED ORDER — CALCIUM CARBONATE ANTACID 500 MG PO CHEW
1.0000 | CHEWABLE_TABLET | Freq: Three times a day (TID) | ORAL | Status: DC
  Administered 2023-03-25 – 2023-03-26 (×4): 200 mg via ORAL
  Filled 2023-03-24 (×4): qty 1

## 2023-03-24 MED ORDER — MEDROXYPROGESTERONE ACETATE 150 MG/ML IM SUSP: 150.0000 mg | INTRAMUSCULAR | Status: DC | PRN

## 2023-03-24 MED ORDER — LACTATED RINGERS IV SOLN: 500.0000 mL | Freq: Once | INTRAVENOUS | Status: DC

## 2023-03-24 MED ORDER — ONDANSETRON HCL 4 MG/2ML IJ SOLN
4.0000 mg | Freq: Four times a day (QID) | INTRAMUSCULAR | Status: DC | PRN
  Administered 2023-03-24: 4 mg via INTRAVENOUS
  Filled 2023-03-24: qty 2

## 2023-03-24 MED ORDER — OXYTOCIN BOLUS FROM INFUSION
333.0000 mL | Freq: Once | INTRAVENOUS | Status: AC
  Administered 2023-03-24: 333 mL via INTRAVENOUS

## 2023-03-24 MED ORDER — MISOPROSTOL 50MCG HALF TABLET: 50.0000 ug | ORAL_TABLET | Freq: Once | ORAL | Status: DC

## 2023-03-24 MED ORDER — ACETAMINOPHEN 325 MG PO TABS
650.0000 mg | ORAL_TABLET | ORAL | Status: DC | PRN
  Administered 2023-03-25 (×2): 650 mg via ORAL
  Filled 2023-03-24 (×2): qty 2

## 2023-03-24 MED ORDER — OXYTOCIN-SODIUM CHLORIDE 30-0.9 UT/500ML-% IV SOLN
2.5000 [IU]/h | INTRAVENOUS | Status: DC
  Filled 2023-03-24: qty 500

## 2023-03-24 MED ORDER — IBUPROFEN 600 MG PO TABS
600.0000 mg | ORAL_TABLET | Freq: Four times a day (QID) | ORAL | Status: DC
  Administered 2023-03-25 – 2023-03-26 (×7): 600 mg via ORAL
  Filled 2023-03-24 (×7): qty 1

## 2023-03-24 MED ORDER — SOD CITRATE-CITRIC ACID 500-334 MG/5ML PO SOLN: 30.0000 mL | ORAL | Status: DC | PRN

## 2023-03-24 MED ORDER — LACTATED RINGERS IV SOLN: 500.0000 mL | INTRAVENOUS | Status: DC | PRN

## 2023-03-24 MED ORDER — ZOLPIDEM TARTRATE 5 MG PO TABS: 5.0000 mg | ORAL_TABLET | Freq: Every evening | ORAL | Status: DC | PRN

## 2023-03-24 MED ORDER — ESCITALOPRAM OXALATE 10 MG PO TABS
5.0000 mg | ORAL_TABLET | Freq: Every day | ORAL | Status: DC
  Filled 2023-03-24: qty 1

## 2023-03-24 NOTE — Anesthesia Procedure Notes (Signed)
Epidural Patient location during procedure: OB Start time: 03/24/2023 1:00 PM End time: 03/24/2023 1:08 PM  Staffing Anesthesiologist: Atilano Median, DO Performed: anesthesiologist   Preanesthetic Checklist Completed: patient identified, IV checked, site marked, risks and benefits discussed, surgical consent, monitors and equipment checked, pre-op evaluation and timeout performed  Epidural Patient position: sitting Prep: ChloraPrep Patient monitoring: heart rate, continuous pulse ox and blood pressure Approach: midline Location: L4-L5 Injection technique: LOR saline  Needle:  Needle type: Tuohy  Needle gauge: 17 G Needle length: 9 cm Needle insertion depth: 6 cm Catheter type: closed end flexible Catheter size: 20 Guage Catheter at skin depth: 12 cm Test dose: negative and 1.5% lidocaine with Epi 1:200 K  Assessment Events: blood not aspirated, no cerebrospinal fluid, injection not painful, no injection resistance and no paresthesia  Additional Notes Patient identified. Risks/Benefits/Options discussed with patient including but not limited to bleeding, infection, nerve damage, paralysis, failed block, incomplete pain control, headache, blood pressure changes, nausea, vomiting, reactions to medications, itching and postpartum back pain. Confirmed with bedside nurse the patient's most recent platelet count. Confirmed with patient that they are not currently taking any anticoagulation, have any bleeding history or any family history of bleeding disorders. Patient expressed understanding and wished to proceed. All questions were answered. Sterile technique was used throughout the entire procedure. Please see nursing notes for vital signs. Test dose was given through epidural catheter and negative prior to continuing to dose epidural or start infusion. Warning signs of high block given to the patient including shortness of breath, tingling/numbness in hands, complete motor block,  or any concerning symptoms with instructions to call for help. Patient was given instructions on fall risk and not to get out of bed. All questions and concerns addressed with instructions to call with any issues or inadequate analgesia.    Reason for block:procedure for pain

## 2023-03-24 NOTE — H&P (Signed)
Dana Kidd is a 27 y.o. female presenting for elective IOL  Pregnancy uncomplicated GBS neg. OB History     Gravida  1   Para  0   Term  0   Preterm  0   AB  0   Living  0      SAB  0   IAB  0   Ectopic  0   Multiple  0   Live Births  0          Past Medical History:  Diagnosis Date   Anxiety    Chlamydia 05/2020   Dysplastic nevus of trunk 07/27/2016   Left breast mass    complicated cyst versus fibroadenoma - followed by ultrasound   Lymph nodes enlarged 12/01/2021   Mental disorder    anxiety   Past Surgical History:  Procedure Laterality Date   broken finger Right 2015   --ring finger   Family History: family history includes Breast cancer in her paternal aunt; Cancer in her paternal grandmother; Diabetes in her maternal grandmother; Heart Problems in her paternal grandfather; Hypertension in her maternal grandfather, maternal grandmother, and paternal grandfather; Irritable bowel syndrome in her mother; Osteoarthritis in her maternal grandfather. Social History:  reports that she has never smoked. She has never used smokeless tobacco. She reports that she does not drink alcohol and does not use drugs.     Maternal Diabetes: No Genetic Screening: Normal Maternal Ultrasounds/Referrals: Normal Fetal Ultrasounds or other Referrals:  None Maternal Substance Abuse:  No Significant Maternal Medications:  None Significant Maternal Lab Results:  Group B Strep negative Number of Prenatal Visits:greater than 3 verified prenatal visits Maternal Vaccinations:TDap Other Comments:  None  Review of Systems History Dilation: 2.5 Effacement (%): 80 Station: -2 Exam by:: Rana Snare MD Height 5\' 2"  (1.575 m), weight 85.6 kg, last menstrual period 06/24/2022. Exam Physical Exam  Vitals and nursing note reviewed. Exam conducted with a chaperone present.  Constitutional:      Appearance: Normal appearance.  HENT:     Head: Normocephalic.  Eyes:     Pupils:  Pupils are equal, round, and reactive to light.  Cardiovascular:     Rate and Rhythm: Normal rate and regular rhythm.     Pulses: Normal pulses.  Abdominal:     General: Abdomen is Gravid, nontender Neurological:     Mental Status: She is alert. Prenatal labs: ABO, Rh: --/--/B POS (10/10 0700) Antibody: NEG (10/10 0700) Rubella: 2.87 (03/07 1032) RPR: Non Reactive (03/07 1032)  HBsAg: Negative (03/07 1032)  HIV: Non Reactive (03/07 1032)  GBS: Negative/-- (09/26 0000)   Assessment/Plan: IUP at 39 weeks Favorable cx at term for eIOL AROM, pitocin, and wants CLEA Anticipate SVD   Turner Daniels 03/24/2023, 8:29 AM

## 2023-03-24 NOTE — Anesthesia Preprocedure Evaluation (Addendum)
Anesthesia Evaluation  Patient identified by MRN, date of birth, ID band Patient awake    Reviewed: Allergy & Precautions, NPO status , Patient's Chart, lab work & pertinent test results  Airway Mallampati: II  TM Distance: >3 FB Neck ROM: Full    Dental no notable dental hx.    Pulmonary    Pulmonary exam normal        Cardiovascular  Rhythm:Regular Rate:Normal     Neuro/Psych    GI/Hepatic negative GI ROS, Neg liver ROS,,,  Endo/Other  negative endocrine ROS    Renal/GU negative Renal ROS     Musculoskeletal   Abdominal Normal abdominal exam  (+)   Peds  Hematology   Anesthesia Other Findings   Reproductive/Obstetrics                             Anesthesia Physical Anesthesia Plan  ASA: 2  Anesthesia Plan: Epidural   Post-op Pain Management:    Induction:   PONV Risk Score and Plan: 2 and Treatment may vary due to age or medical condition  Airway Management Planned: Natural Airway  Additional Equipment: None  Intra-op Plan:   Post-operative Plan:   Informed Consent: I have reviewed the patients History and Physical, chart, labs and discussed the procedure including the risks, benefits and alternatives for the proposed anesthesia with the patient or authorized representative who has indicated his/her understanding and acceptance.     Dental advisory given  Plan Discussed with:   Anesthesia Plan Comments:        Anesthesia Quick Evaluation

## 2023-03-25 LAB — CBC
HCT: 32.4 % — ABNORMAL LOW (ref 36.0–46.0)
Hemoglobin: 11.2 g/dL — ABNORMAL LOW (ref 12.0–15.0)
MCH: 29.9 pg (ref 26.0–34.0)
MCHC: 34.6 g/dL (ref 30.0–36.0)
MCV: 86.6 fL (ref 80.0–100.0)
Platelets: 186 10*3/uL (ref 150–400)
RBC: 3.74 MIL/uL — ABNORMAL LOW (ref 3.87–5.11)
RDW: 13 % (ref 11.5–15.5)
WBC: 14.5 10*3/uL — ABNORMAL HIGH (ref 4.0–10.5)
nRBC: 0 % (ref 0.0–0.2)

## 2023-03-25 MED ORDER — HYDROXYZINE HCL 25 MG PO TABS: 50.0000 mg | ORAL_TABLET | Freq: Three times a day (TID) | ORAL | Status: DC | PRN

## 2023-03-25 MED ORDER — LORATADINE 10 MG PO TABS
10.0000 mg | ORAL_TABLET | Freq: Every day | ORAL | Status: DC | PRN
  Administered 2023-03-25: 10 mg via ORAL
  Filled 2023-03-25: qty 1

## 2023-03-25 NOTE — Lactation Note (Signed)
This note was copied from a baby's chart. Lactation Consultation Note  Patient Name: Dana Kidd ACZYS'A Date: 03/25/2023 Age:25 hours Reason for consult: Initial assessment;Primapara;1st time breastfeeding;Term (hx of left breast mass (fibroadenoma))  P1- MOB states that infant is latching well, but she does not believe that she is producing enough for infant at this time. MOB has been putting infant to the breast, pumping with DEBP and supplementing with formula. MOB had just pumped 5 mL 20 min prior. LC praised MOB for pumping 5 mL. LC reassured MOB that this is enough EBM. LC cautioned MOB about doing too much stimulation, but she stated that she wants to start collecting EBM now due to her going back to work in 2 months. LC then encouraged MOB to latch infant first, then pump after as needed.  With MOB's permission, LC inspected MOB's nipples to size her for a flange. MOB has been using a 24 mm flange. LC sized her at a 15 mm flange, but LC could only provide 18 mm flanges because it is the smallest size we carry.  LC reviewed feeding infant on cue 8-12x in 24 hrs, not allowing infant to go over hrs without a feeding, CDC milk storage guidelines and LC services handout. LC encouraged MOB to call for f latch assessment and for any needs she may have.   Maternal Data Does the patient have breastfeeding experience prior to this delivery?: No  Feeding Mother's Current Feeding Choice: Breast Milk and Formula  Lactation Tools Discussed/Used Tools: Pump;Flanges Flange Size:  (MOB is using an 18 mm, but needs a 15 mm. Hospital does not have 15 mm flanges.) Breast pump type: Double-Electric Breast Pump;Manual Pump Education: Setup, frequency, and cleaning;Milk Storage Reason for Pumping: MOB's request Pumping frequency: 15-20 min every 2-3 hrs as needed Pumped volume: 5 mL (at 11:30 am)  Interventions Interventions: Breast feeding basics reviewed;DEBP;Education;LC Services  brochure  Discharge Discharge Education: Warning signs for feeding baby  Consult Status Consult Status: Follow-up Date: 03/26/23 Follow-up type: In-patient    Dema Severin BS, IBCLC 03/25/2023, 12:05 PM

## 2023-03-25 NOTE — Progress Notes (Signed)
Postpartum Progress Note  S: No complaints. Feeling well. Lochia appropriate. No subjective fevers/chills. Working on breastfeeding.  O:     03/25/2023    7:10 AM 03/25/2023    3:10 AM 03/24/2023   11:10 PM  Vitals with BMI  Systolic 96 94 104  Diastolic 64 69 67  Pulse 100 82 98    Gen: NAD, A&O Pulm: NWOB Abd: soft, appropriately ttp, fundus firm and below Umb Ext: No evidence of DVT, trace edema b/l  Labs Recent Results (from the past 2160 hour(s))  OB RESULT CONSOLE Group B Strep     Status: None   Collection Time: 03/10/23 12:00 AM  Result Value Ref Range   GBS Negative   CBC     Status: None   Collection Time: 03/24/23  7:00 AM  Result Value Ref Range   WBC 9.4 4.0 - 10.5 K/uL   RBC 4.33 3.87 - 5.11 MIL/uL   Hemoglobin 13.1 12.0 - 15.0 g/dL   HCT 40.1 02.7 - 25.3 %   MCV 87.5 80.0 - 100.0 fL   MCH 30.3 26.0 - 34.0 pg   MCHC 34.6 30.0 - 36.0 g/dL   RDW 66.4 40.3 - 47.4 %   Platelets 230 150 - 400 K/uL   nRBC 0.0 0.0 - 0.2 %    Comment: Performed at [facility], [address], [place], [place] [address]  Type and screen [facility]     Status: None   Collection Time: 03/24/23  7:00 AM  Result Value Ref Range   ABO/RH(D) B POS    Antibody Screen NEG    Sample Expiration      03/27/2023,2359 Performed at [facility], [address] [place], [place] [address]   RPR     Status: None   Collection Time: 03/24/23  7:00 AM  Result Value Ref Range   RPR Ser Ql NON REACTIVE NON REACTIVE    Comment: Performed at [facility], [address], [place], [place] [address]  CBC     Status: Abnormal   Collection Time: 03/25/23  5:28 AM  Result Value Ref Range   WBC 14.5 (H) 4.0 - 10.5 K/uL   RBC 3.74 (L) 3.87 - 5.11 MIL/uL   Hemoglobin 11.2 (L) 12.0 - 15.0 g/dL   HCT 51.8 (L) 84.1 - 66.0 %   MCV 86.6 80.0 - 100.0 fL   MCH 29.9 26.0 - 34.0 pg   MCHC 34.6 30.0 - 36.0 g/dL   RDW 63.0 16.0 - 10.9 %   Platelets 186 150 - 400 K/uL    nRBC 0.0 0.0 - 0.2 %    Comment: Performed at [facility], [address], [place], [place] [address]     A/P:  PPD1 s/p SVD, doing well pp. AFVSS. Benign exam.  No sig med/surg issues.  Continue present care. Plan for d/c PPD#2.   Jule Economy, MD

## 2023-03-25 NOTE — Anesthesia Postprocedure Evaluation (Signed)
Anesthesia Post Note  Patient: Dana Kidd  Procedure(s) Performed: AN AD HOC LABOR EPIDURAL     Patient location during evaluation: Mother Baby Anesthesia Type: Epidural Level of consciousness: awake and alert Pain management: pain level controlled Vital Signs Assessment: post-procedure vital signs reviewed and stable Respiratory status: spontaneous breathing, nonlabored ventilation and respiratory function stable Cardiovascular status: stable Postop Assessment: no headache, no backache, epidural receding, no apparent nausea or vomiting, patient able to bend at knees, able to ambulate and adequate PO intake Anesthetic complications: no   No notable events documented.  Last Vitals:  Vitals:   03/25/23 0310 03/25/23 0710  BP: 94/69 96/64  Pulse: 82 100  Resp: 20 15  Temp: 36.7 C 36.7 C  SpO2: 98% 99%    Last Pain:  Vitals:   03/25/23 0843  TempSrc:   PainSc: 0-No pain   Pain Goal:                   Dana Kidd

## 2023-03-25 NOTE — Social Work (Addendum)
MOB was referred for history of depression/anxiety.  * Referral screened out by Clinical Social Worker because none of the following criteria appear to apply:  ~ History of anxiety/depression during this pregnancy, or of post-partum depression following prior delivery.  ~ Diagnosis of anxiety and/or depression within last 3 years OR * MOB's symptoms currently being treated with medication and/or therapy.  Per chart review MOB  was diagnosed prior to October 2021. No recent MH concerns noted.   Please contact the Clinical Social Worker if needs arise, or by MOB request.  Wende Neighbors, LCSWA Clinical Social Worker [phone]

## 2023-03-26 ENCOUNTER — Ambulatory Visit (HOSPITAL_COMMUNITY): Payer: Self-pay

## 2023-03-26 MED ORDER — ESCITALOPRAM OXALATE 5 MG PO TABS: 5.0000 mg | ORAL_TABLET | Freq: Every day | ORAL | 2 refills | Status: AC

## 2023-03-26 MED ORDER — ACETAMINOPHEN 325 MG PO TABS: 650.0000 mg | ORAL_TABLET | ORAL | 1 refills | Status: AC | PRN

## 2023-03-26 MED ORDER — IBUPROFEN 600 MG PO TABS: 600.0000 mg | ORAL_TABLET | Freq: Four times a day (QID) | ORAL | 0 refills | Status: AC

## 2023-03-26 NOTE — Lactation Note (Signed)
This note was copied from a baby's chart. Lactation Consultation Note  Patient Name: Dana Kidd WGNFA'O Date: 03/26/2023 Age:25 hours Reason for consult: Follow-up assessment;Primapara;1st time breastfeeding;Term  P1- MOB states that infant is now refusing the breasts after MOB has been mostly feeding formula. MOB states that she will continue to pump and attempt to latch infant until her milk comes in. MOB states that she thinks the infant will latch much better once the milk comes in because it will flow like the bottle. LC encouraged MOB to pace feed infant and continue stimulating her breasts.  LC reviewed LC services handout, CDC milk storage guidelines and engorgement/breast care. LC encouraged MOB to call lactation team if she needs further support once discharged.  Maternal Data Has patient been taught Hand Expression?: Yes Does the patient have breastfeeding experience prior to this delivery?: No  Feeding Mother's Current Feeding Choice: Breast Milk and Formula Nipple Type: Slow - flow  Lactation Tools Discussed/Used Tools: Pump;Flanges Breast pump type: Manual Pump Education: Milk Storage;Setup, frequency, and cleaning  Interventions Interventions: Breast feeding basics reviewed;Pace feeding;Education;LC Services brochure  Discharge Discharge Education: Engorgement and breast care;Warning signs for feeding baby Pump: DEBP;Personal  Consult Status Consult Status: Complete Date: 03/26/23    Dema Severin BS, IBCLC 03/26/2023, 1:42 PM

## 2023-03-26 NOTE — Discharge Summary (Signed)
Postpartum Discharge Summary  Date of Service updated 03/26/2023     Patient Name: Dana Kidd DOB: 04-Nov-1995 MRN: [id number]  Date of admission: 03/24/2023 Delivery date:03/24/2023 Delivering provider: Candice Camp Date of discharge: 03/26/2023  Admitting diagnosis: Pregnancy [Z34.90] Intrauterine pregnancy: [redacted]w[redacted]d     Secondary diagnosis:  Principal Problem:   Pregnancy  Additional problems: Anxiety    Discharge diagnosis: Term Pregnancy Delivered                                              Post partum procedures: none Augmentation: AROM and Pitocin Complications: None  Hospital course: Induction of Labor With Vaginal Delivery   27 y.o. yo G1P1001 at [redacted]w[redacted]d was admitted to the hospital 03/24/2023 for induction of labor.  Indication for induction: Elective.  Patient had an labor course complicated by none Membrane Rupture Time/Date: 8:25 AM,03/24/2023  Delivery Method:Vaginal, Spontaneous Operative Delivery:N/A Episiotomy: None Lacerations:  None Details of delivery can be found in separate delivery note.  Patient had a postpartum course complicated by none. Patient is discharged home 03/26/23.  Newborn Data: Birth date:03/24/2023 Birth time:7:55 PM Gender:Female Living status:Living Apgars:8 ,9  Weight:3190 g  Magnesium Sulfate received: No BMZ received: No Rhophylac:N/A MMR:No T-DaP:Given prenatally Flu: No RSV Vaccine received: No Transfusion:No Immunizations administered: Immunization History  Administered Date(s) Administered   HPV 9-valent 01/06/2018, 06/10/2020   Influenza,inj,Quad PF,6+ Mos 03/20/2017   Influenza,inj,quad, With Preservative 02/21/2018   PPD Test 09/07/2016   Tdap 01/23/2019    Physical exam  Vitals:   03/25/23 1447 03/25/23 1616 03/25/23 2043 03/26/23 0527  BP: 104/78 113/81 110/81 99/74  Pulse: 96 99 98 85  Resp: 18 18 19    Temp: 97.9 F (36.6 C) 99.4 F (37.4 C) 98 F (36.7 C) 97.6 F (36.4 C)  TempSrc: Oral  Oral Oral Oral  SpO2: 100% 100% 99% 100%  Weight:      Height:       General: alert, cooperative, and no distress Lochia: appropriate Uterine Fundus: firm Incision: N/A DVT Evaluation: No evidence of DVT seen on physical exam. Labs: Lab Results  Component Value Date   WBC 14.5 (H) 03/25/2023   HGB 11.2 (L) 03/25/2023   HCT 32.4 (L) 03/25/2023   MCV 86.6 03/25/2023   PLT 186 03/25/2023      Latest Ref Rng & Units 09/13/2022   10:58 AM  CMP  Glucose 70 - 99 mg/dL 83   BUN 6 - 20 mg/dL 5   Creatinine 1.66 - 0.63 mg/dL 0.16   Sodium 010 - 932 mmol/L 136   Potassium 3.5 - 5.2 mmol/L 4.4   Chloride 96 - 106 mmol/L 103   CO2 20 - 29 mmol/L 17   Calcium 8.7 - 10.2 mg/dL 8.8   Total Protein 6.0 - 8.5 g/dL 6.5   Total Bilirubin 0.0 - 1.2 mg/dL 0.3   Alkaline Phos 44 - 121 IU/L 70   AST 0 - 40 IU/L 17   ALT 0 - 32 IU/L 9    Edinburgh Score:    03/25/2023    7:10 AM  Edinburgh Postnatal Depression Scale Screening Tool  I have been able to laugh and see the funny side of things. 0  I have looked forward with enjoyment to things. 0  I have blamed myself unnecessarily when things went wrong. 1  I have been  anxious or worried for no good reason. 0  I have felt scared or panicky for no good reason. 1  Things have been getting on top of me. 0  I have been so unhappy that I have had difficulty sleeping. 0  I have felt sad or miserable. 0  I have been so unhappy that I have been crying. 0  The thought of harming myself has occurred to me. 0  Edinburgh Postnatal Depression Scale Total 2      After visit meds:  Allergies as of 03/26/2023       Reactions   Guaifenesin Anaphylaxis   hives hives Other reaction(s): Anaphylaxis hives hives        Medication List     STOP taking these medications    ondansetron 4 MG disintegrating tablet Commonly known as: ZOFRAN-ODT   promethazine 25 MG tablet Commonly known as: PHENERGAN       TAKE these medications     acetaminophen 325 MG tablet Commonly known as: Tylenol Take 2 tablets (650 mg total) by mouth every 4 (four) hours as needed (for pain scale < 4).   escitalopram 5 MG tablet Commonly known as: LEXAPRO Take 1 tablet (5 mg total) by mouth daily.   ibuprofen 600 MG tablet Commonly known as: ADVIL Take 1 tablet (600 mg total) by mouth every 6 (six) hours.   Prenate Mini 18-0.6-0.4-350 MG Caps Take 1 tablet by mouth daily.         Discharge home in stable condition Infant Feeding: Bottle and Breast Infant Disposition:home with mother Discharge instruction: per After Visit Summary and Postpartum booklet. Activity: Advance as tolerated. Pelvic rest for 6 weeks.  Diet: routine diet Anticipated Birth Control: Likely Mirena IUD Postpartum Appointment:6 weeks Additional Postpartum F/U: Mood check in 2 weeks Future Appointments:No future appointments.   03/26/2023 Tawni Levy, MD

## 2023-03-26 NOTE — Progress Notes (Signed)
CSW received and acknowledged duplicate consult for hx of anxiety for MOB.  CSW K. Bowser completed chart review on 03/25/23 (see note) and consult was a screen out.   MOB's Edinburgh Score is a 2. There are no other psychosocial concerns noted in chart.  CSW is screening out duplicate consult.    There are no barriers to discharge.   Blaine Hamper, MSW, LCSW Clinical Social Work [phone]

## 2023-03-29 ENCOUNTER — Encounter (HOSPITAL_COMMUNITY): Payer: Self-pay | Admitting: Obstetrics and Gynecology

## 2023-03-29 ENCOUNTER — Inpatient Hospital Stay (HOSPITAL_COMMUNITY)
Admission: AD | Admit: 2023-03-29 | Discharge: 2023-03-29 | Disposition: A | Discharge status: Home / Self Care | Payer: 59 | Attending: Obstetrics and Gynecology | Admitting: Obstetrics and Gynecology

## 2023-03-29 DIAGNOSIS — R030 Elevated blood-pressure reading, without diagnosis of hypertension: Secondary | ICD-10-CM | POA: Diagnosis not present

## 2023-03-29 DIAGNOSIS — N939 Abnormal uterine and vaginal bleeding, unspecified: Secondary | ICD-10-CM | POA: Diagnosis present

## 2023-03-29 DIAGNOSIS — O9089 Other complications of the puerperium, not elsewhere classified: Secondary | ICD-10-CM | POA: Insufficient documentation

## 2023-03-29 DIAGNOSIS — R1030 Lower abdominal pain, unspecified: Secondary | ICD-10-CM | POA: Diagnosis present

## 2023-03-29 LAB — COMPREHENSIVE METABOLIC PANEL
ALT: 38 U/L (ref 0–44)
AST: 35 U/L (ref 15–41)
Albumin: 2.7 g/dL — ABNORMAL LOW (ref 3.5–5.0)
Alkaline Phosphatase: 113 U/L (ref 38–126)
Anion gap: 12 (ref 5–15)
BUN: 10 mg/dL (ref 6–20)
CO2: 22 mmol/L (ref 22–32)
Calcium: 9.8 mg/dL (ref 8.9–10.3)
Chloride: 105 mmol/L (ref 98–111)
Creatinine, Ser: 0.85 mg/dL (ref 0.44–1.00)
GFR, Estimated: >60 mL/min (ref >60)
Glucose, Bld: 89 mg/dL (ref 70–99)
Potassium: 4.3 mmol/L (ref 3.5–5.1)
Sodium: 139 mmol/L (ref 135–145)
Total Bilirubin: 0.4 mg/dL (ref 0.3–1.2)
Total Protein: 6.1 g/dL — ABNORMAL LOW (ref 6.5–8.1)

## 2023-03-29 LAB — CBC WITH DIFFERENTIAL/PLATELET
Abs Immature Granulocytes: 0.04 10*3/uL (ref 0.00–0.07)
Basophils Absolute: 0.1 10*3/uL (ref 0.0–0.1)
Basophils Relative: 1 %
Eosinophils Absolute: 0.2 10*3/uL (ref 0.0–0.5)
Eosinophils Relative: 2 %
HCT: 35 % — ABNORMAL LOW (ref 36.0–46.0)
Hemoglobin: 12 g/dL (ref 12.0–15.0)
Immature Granulocytes: 0 %
Lymphocytes Relative: 23 %
Lymphs Abs: 2.2 10*3/uL (ref 0.7–4.0)
MCH: 29.9 pg (ref 26.0–34.0)
MCHC: 34.3 g/dL (ref 30.0–36.0)
MCV: 87.3 fL (ref 80.0–100.0)
Monocytes Absolute: 0.6 10*3/uL (ref 0.1–1.0)
Monocytes Relative: 6 %
Neutro Abs: 6.5 10*3/uL (ref 1.7–7.7)
Neutrophils Relative %: 68 %
Platelets: 263 10*3/uL (ref 150–400)
RBC: 4.01 MIL/uL (ref 3.87–5.11)
RDW: 12.8 % (ref 11.5–15.5)
WBC: 9.6 10*3/uL (ref 4.0–10.5)
nRBC: 0 % (ref 0.0–0.2)

## 2023-03-29 NOTE — MAU Note (Signed)
Pt says she del vag on  Thurs 03-24-2023 [place] home on Sat 03-26-2023- all ok Then at 0300- passed a large clot- 1/2 dollar size vag. Vag bleeding - ok Says lower abd pain  is more 4/10-  Took 800mg  Ibuprofen at 0330 4/10 BP- 132/92 at 0300 Usually BP was good

## 2023-03-29 NOTE — MAU Provider Note (Signed)
Chief Complaint  Patient presents with   Vaginal Bleeding   Abdominal Pain     S: Dana Kidd  is a 27 y.o. y.o. year old G73P1001 female at 5 days postpartum uncomplicated spontaneous vaginal delivery who presents to MAU with passing a blood clot and concern for elevated blood pressures.  No Hx HTN. Current blood pressure medication: None.  Passed 4 cm dark red blood clot.  Scant bleeding since then.  Blood pressure 132/92 at home.  Reports 4/10 lower abdominal cramping and mild headache.  Headache resolved with Tylenol.  Lower abdominal cramping unchanged.  Associated symptoms: Negative for fever, chills, abdominal tenderness, HA, vision changes, epigastric pain, dizziness.  O: Patient Vitals for the past 24 hrs:  BP Temp Temp src Pulse Resp Height Weight  03/29/23 0440 119/80 98.2 F (36.8 C) Oral 100 16 5\' 2"  (1.575 m) 80.6 kg  No data found.  General: NAD Heart: Regular rate Lungs: Normal rate and effort Abd: Soft, NT, fundus firm 2/SP. Extremities: No pedal edema Neuro: 2+ deep tendon reflexes, No clonus Pelvic: NEFG, scant bleeding   Results for orders placed or performed during the hospital encounter of 03/29/23 (from the past 24 hour(s))  CBC with Differential/Platelet     Status: Abnormal   Collection Time: 03/29/23  5:25 AM  Result Value Ref Range   WBC 9.6 4.0 - 10.5 K/uL   RBC 4.01 3.87 - 5.11 MIL/uL   Hemoglobin 12.0 12.0 - 15.0 g/dL   HCT 09.8 (L) 11.9 - 14.7 %   MCV 87.3 80.0 - 100.0 fL   MCH 29.9 26.0 - 34.0 pg   MCHC 34.3 30.0 - 36.0 g/dL   RDW 82.9 56.2 - 13.0 %   Platelets 263 150 - 400 K/uL   nRBC 0.0 0.0 - 0.2 %   Neutrophils Relative % 68 %   Neutro Abs 6.5 1.7 - 7.7 K/uL   Lymphocytes Relative 23 %   Lymphs Abs 2.2 0.7 - 4.0 K/uL   Monocytes Relative 6 %   Monocytes Absolute 0.6 0.1 - 1.0 K/uL   Eosinophils Relative 2 %   Eosinophils Absolute 0.2 0.0 - 0.5 K/uL   Basophils Relative 1 %   Basophils Absolute 0.1 0.0 - 0.1 K/uL   Immature  Granulocytes 0 %   Abs Immature Granulocytes 0.04 0.00 - 0.07 K/uL    MAU Course Orders Placed This Encounter  Procedures   CBC with Differential/Platelet    Standing Status:   Standing    Number of Occurrences:   1   Comprehensive metabolic panel    Standing Status:   Standing    Number of Occurrences:   1   Vital signs    Standing Status:   Standing    Number of Occurrences:   1   Discharge patient    Order Specific Question:   Discharge disposition    Answer:   01-Home or Self Care [1]    Order Specific Question:   Discharge patient date    Answer:   03/29/2023   No orders of the defined types were placed in this encounter.    MDM -Patient passed relatively small blood clot 5 days postpartum consistent with sloughing of eschar.  Scant active bleeding.  CBC stable.  Reassured patient and discussed expectations for postpartum bleeding and warning signs to watch out for.  - Mildly elevated blood pressure at home with normal blood pressures and normal preeclampsia labs in MAU.  No Pre-e symptoms.  No evidence of postpartum preeclampsia.  A: [redacted]w[redacted]d week IUP 1. Elevated blood pressure reading without diagnosis of hypertension   2. Abnormal lochia   FHR reactive  P: Discharge home in stable condition  Preeclampsia precautions.  Follow-up Information     [place] Follow up.   Why: For postpartum visit or sooner as needed if symptoms worsen, Contact information: [address] [place] [address] [phone]         [facility] Follow up.   Specialty: Obstetrics and Gynecology Why: As needed if symptoms worsen Contact information: [address] [place] [address] [phone]               Allergies as of 03/29/2023       Reactions   Guaifenesin Anaphylaxis   hives hives Other reaction(s): Anaphylaxis hives hives        Medication List     TAKE these  medications    acetaminophen 325 MG tablet Commonly known as: Tylenol Take 2 tablets (650 mg total) by mouth every 4 (four) hours as needed (for pain scale < 4).   escitalopram 5 MG tablet Commonly known as: LEXAPRO Take 1 tablet (5 mg total) by mouth daily.   ibuprofen 600 MG tablet Commonly known as: ADVIL Take 1 tablet (600 mg total) by mouth every 6 (six) hours.   Prenate Mini 18-0.6-0.4-350 MG Caps Take 1 tablet by mouth daily.         Katrinka Blazing, [place], CNM 03/29/2023 7:03 AM

## 2023-04-07 ENCOUNTER — Inpatient Hospital Stay (HOSPITAL_COMMUNITY): Payer: 59

## 2023-04-16 ENCOUNTER — Telehealth (HOSPITAL_COMMUNITY): Payer: Self-pay

## 2023-04-16 NOTE — Telephone Encounter (Signed)
04/16/2023 1833  Name: Dana Kidd MRN: [id number] DOB: July 10, 1995  Reason for Call:  Transition of Care Hospital Discharge Call  Contact Status: Patient Contact Status: Message  Language assistant needed: Interpreter Mode: Interpreter Not Needed        Follow-Up Questions:    Inocente Salles Postnatal Depression Scale:  In the Past 7 Days:    PHQ2-9 Depression Scale:     Discharge Follow-up:    Post-discharge interventions: NA  Signature  Signe Colt

## 2023-06-08 IMAGING — CT CT NECK W/ CM
2 of 3 series · 7 of 14 positions shown, 8 images · IV contrast (agent unspecified)
Comparison: Ultrasound soft tissue neck 08/21/1999.

CLINICAL DATA: Cervical lymphadenopathy

EXAM:
CT NECK WITH CONTRAST
TECHNIQUE: Multidetector CT imaging of the neck was performed using the
standard protocol following the bolus administration of intravenous
contrast.

[Series 3: neck · axial · 0.48mm/px · z∈[+639,+763]mm · 3 of 125 slices shown]
[im 32/125  bone]
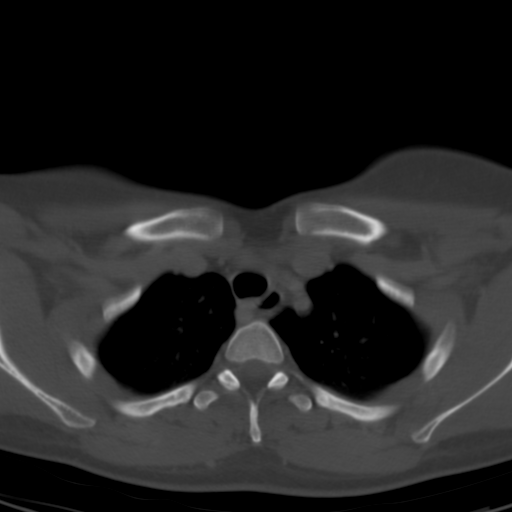
[im 63/125  bone]
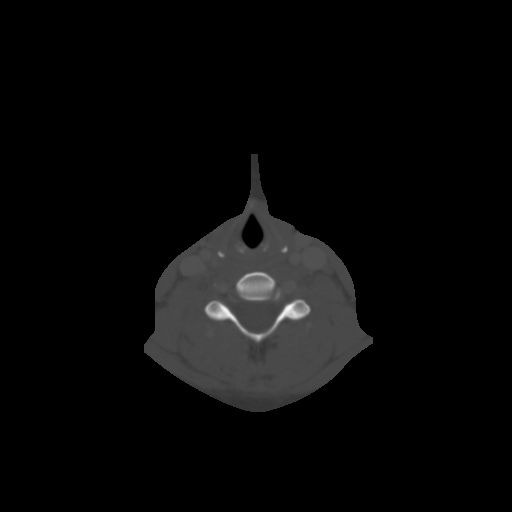
[im 94/125  bone]
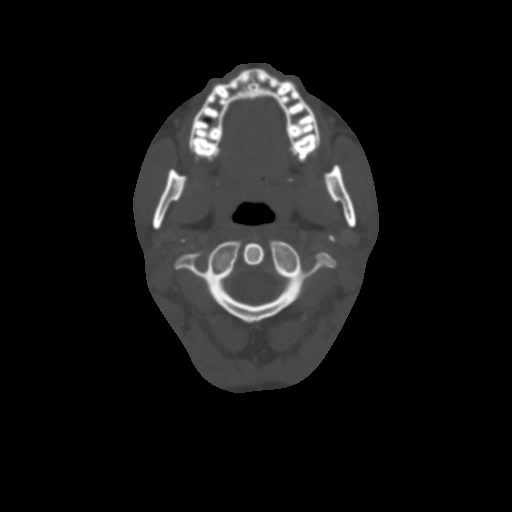

[Series 8: angled axial-oropharynx · axial · 0.39mm/px · z∈[+595,+739]mm · 4 of 129 slices shown, 5 images]
[im 26/129  soft-tissue]
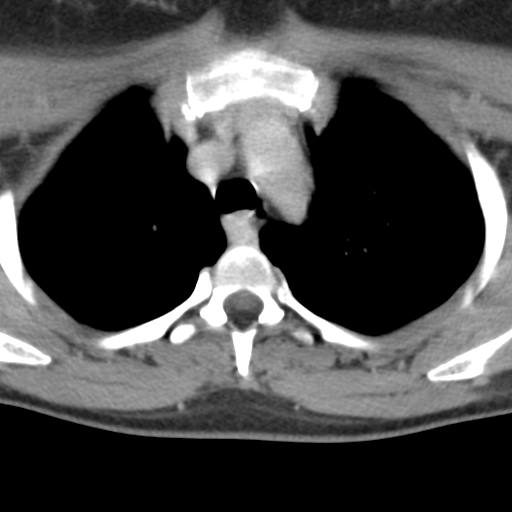
[im 26/129  bone]
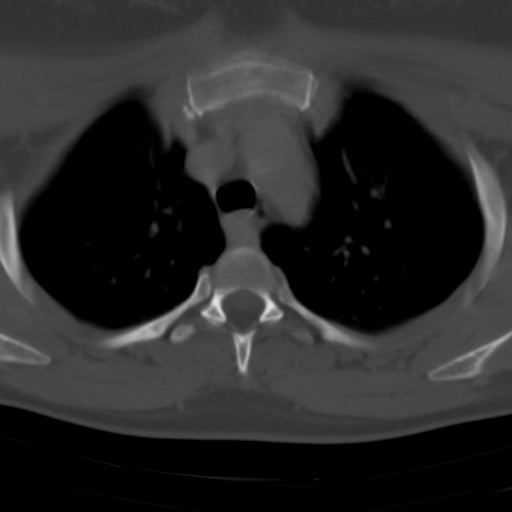
[im 52/129  bone]
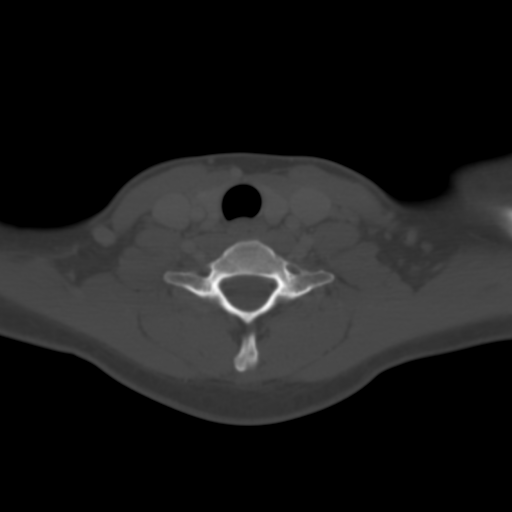
[im 77/129  bone]
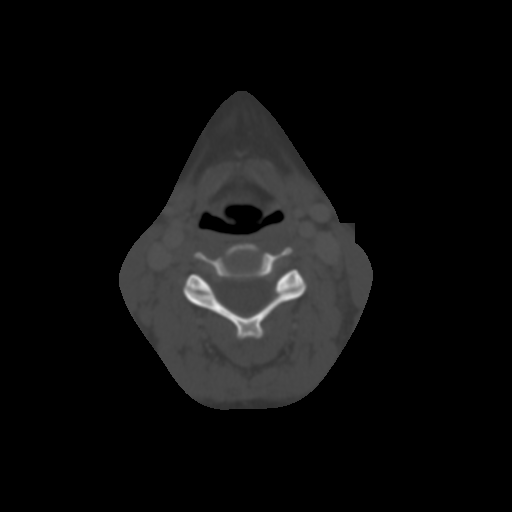
[im 103/129  bone]
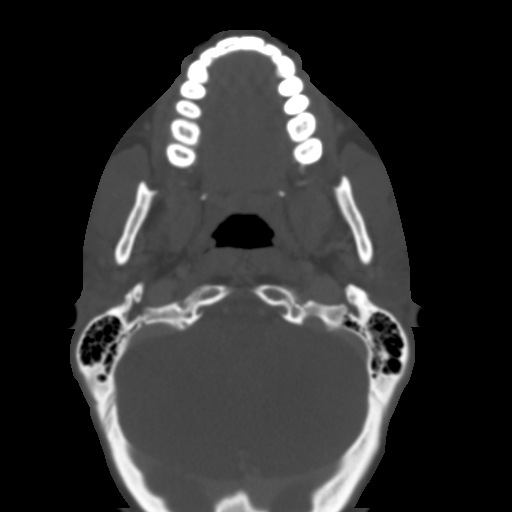

[7 of 14 positions shown; findings below may reference images not displayed]

RADIATION DOSE REDUCTION: This exam was performed according to the
departmental dose-optimization program which includes automated
exposure control, adjustment of the mA and/or kV according to
patient size and/or use of iterative reconstruction technique.

CONTRAST:  75mL CEKLCQ-CEE IOPAMIDOL (CEKLCQ-CEE) INJECTION 61%
FINDINGS: Pharynx and larynx: Prominent tonsil bilaterally which is symmetric.
No mass or abscess. No airway intact. Epiglottis and larynx normal.

Salivary glands: No inflammation, mass, or stone.

Thyroid: Negative

Lymph nodes: Prominent posterior lymph nodes on the right measuring
up to 1 cm in short axis dimension.

Left level 2 lymph node 7 mm and 10 mm. Subcentimeter posterior
lymph nodes on the left.

Vascular: Normal vascular enhancement

Limited intracranial: Negative

Visualized orbits: Negative

Mastoids and visualized paranasal sinuses: Paranasal sinuses clear.

Skeleton: Negative

Upper chest: Lung apices clear bilaterally.

Other: None
IMPRESSION: Bilateral cervical lymph nodes which are upper normal in diameter.
These may be reactive lymph nodes. Lymphoproliferative disorder is
possible and biopsy recommended if these continue to enlarge.

Prominent tonsils bilaterally which may be due to chronic
pharyngitis.
